# Patient Record
Sex: Female | Born: 1955 | Hispanic: No | Marital: Married | Smoking: Former smoker
Health system: Southern US, Community
[De-identification: ages and names within clinical notes are randomized; demographics above are authoritative.]

## PROBLEM LIST (undated history)

## (undated) DIAGNOSIS — J189 Pneumonia, unspecified organism: Secondary | ICD-10-CM

## (undated) DIAGNOSIS — F419 Anxiety disorder, unspecified: Secondary | ICD-10-CM

## (undated) DIAGNOSIS — M1990 Unspecified osteoarthritis, unspecified site: Secondary | ICD-10-CM

## (undated) DIAGNOSIS — I10 Essential (primary) hypertension: Secondary | ICD-10-CM

## (undated) DIAGNOSIS — E7800 Pure hypercholesterolemia, unspecified: Secondary | ICD-10-CM

## (undated) HISTORY — PX: CARDIAC CATHETERIZATION: SHX172

## (undated) HISTORY — PX: SPINAL CORD STIMULATOR IMPLANT: SHX2422

## (undated) HISTORY — PX: VAGINAL HYSTERECTOMY: SUR661

## (undated) HISTORY — DX: Unspecified osteoarthritis, unspecified site: M19.90

## (undated) HISTORY — PX: JOINT REPLACEMENT: SHX530

## (undated) HISTORY — DX: Pure hypercholesterolemia, unspecified: E78.00

## (undated) HISTORY — PX: ACHILLES TENDON SURGERY: SHX542

## (undated) HISTORY — PX: WISDOM TOOTH EXTRACTION: SHX21

## (undated) HISTORY — PX: TONSILLECTOMY: SUR1361

---

## 1988-08-24 HISTORY — PX: BREAST LUMPECTOMY: SHX2

## 2020-01-15 DIAGNOSIS — M9901 Segmental and somatic dysfunction of cervical region: Secondary | ICD-10-CM | POA: Diagnosis not present

## 2020-01-15 DIAGNOSIS — M9902 Segmental and somatic dysfunction of thoracic region: Secondary | ICD-10-CM | POA: Diagnosis not present

## 2020-01-15 DIAGNOSIS — M542 Cervicalgia: Secondary | ICD-10-CM | POA: Diagnosis not present

## 2020-01-15 DIAGNOSIS — G44201 Tension-type headache, unspecified, intractable: Secondary | ICD-10-CM | POA: Diagnosis not present

## 2020-01-16 DIAGNOSIS — M9902 Segmental and somatic dysfunction of thoracic region: Secondary | ICD-10-CM | POA: Diagnosis not present

## 2020-01-16 DIAGNOSIS — M542 Cervicalgia: Secondary | ICD-10-CM | POA: Diagnosis not present

## 2020-01-16 DIAGNOSIS — G44201 Tension-type headache, unspecified, intractable: Secondary | ICD-10-CM | POA: Diagnosis not present

## 2020-01-16 DIAGNOSIS — M9901 Segmental and somatic dysfunction of cervical region: Secondary | ICD-10-CM | POA: Diagnosis not present

## 2020-01-17 DIAGNOSIS — G44201 Tension-type headache, unspecified, intractable: Secondary | ICD-10-CM | POA: Diagnosis not present

## 2020-01-17 DIAGNOSIS — M542 Cervicalgia: Secondary | ICD-10-CM | POA: Diagnosis not present

## 2020-01-17 DIAGNOSIS — G4733 Obstructive sleep apnea (adult) (pediatric): Secondary | ICD-10-CM | POA: Diagnosis not present

## 2020-01-17 DIAGNOSIS — M9902 Segmental and somatic dysfunction of thoracic region: Secondary | ICD-10-CM | POA: Diagnosis not present

## 2020-01-17 DIAGNOSIS — M9901 Segmental and somatic dysfunction of cervical region: Secondary | ICD-10-CM | POA: Diagnosis not present

## 2020-01-24 DIAGNOSIS — M9902 Segmental and somatic dysfunction of thoracic region: Secondary | ICD-10-CM | POA: Diagnosis not present

## 2020-01-24 DIAGNOSIS — M9901 Segmental and somatic dysfunction of cervical region: Secondary | ICD-10-CM | POA: Diagnosis not present

## 2020-01-24 DIAGNOSIS — G44201 Tension-type headache, unspecified, intractable: Secondary | ICD-10-CM | POA: Diagnosis not present

## 2020-01-24 DIAGNOSIS — M542 Cervicalgia: Secondary | ICD-10-CM | POA: Diagnosis not present

## 2020-01-25 DIAGNOSIS — G44201 Tension-type headache, unspecified, intractable: Secondary | ICD-10-CM | POA: Diagnosis not present

## 2020-01-25 DIAGNOSIS — M9902 Segmental and somatic dysfunction of thoracic region: Secondary | ICD-10-CM | POA: Diagnosis not present

## 2020-01-25 DIAGNOSIS — M9901 Segmental and somatic dysfunction of cervical region: Secondary | ICD-10-CM | POA: Diagnosis not present

## 2020-01-25 DIAGNOSIS — M542 Cervicalgia: Secondary | ICD-10-CM | POA: Diagnosis not present

## 2020-01-26 DIAGNOSIS — G44201 Tension-type headache, unspecified, intractable: Secondary | ICD-10-CM | POA: Diagnosis not present

## 2020-01-26 DIAGNOSIS — M542 Cervicalgia: Secondary | ICD-10-CM | POA: Diagnosis not present

## 2020-01-26 DIAGNOSIS — M9902 Segmental and somatic dysfunction of thoracic region: Secondary | ICD-10-CM | POA: Diagnosis not present

## 2020-01-26 DIAGNOSIS — M9901 Segmental and somatic dysfunction of cervical region: Secondary | ICD-10-CM | POA: Diagnosis not present

## 2020-01-30 ENCOUNTER — Other Ambulatory Visit: Payer: Self-pay

## 2020-01-30 ENCOUNTER — Encounter: Payer: Self-pay | Admitting: Internal Medicine

## 2020-01-30 ENCOUNTER — Ambulatory Visit (INDEPENDENT_AMBULATORY_CARE_PROVIDER_SITE_OTHER): Payer: Federal, State, Local not specified - PPO | Admitting: Internal Medicine

## 2020-01-30 VITALS — BP 142/68 | HR 55 | Temp 98.2°F | Ht 63.0 in | Wt 205.3 lb

## 2020-01-30 DIAGNOSIS — R001 Bradycardia, unspecified: Secondary | ICD-10-CM

## 2020-01-30 DIAGNOSIS — M797 Fibromyalgia: Secondary | ICD-10-CM | POA: Insufficient documentation

## 2020-01-30 DIAGNOSIS — F32A Depression, unspecified: Secondary | ICD-10-CM | POA: Insufficient documentation

## 2020-01-30 DIAGNOSIS — F339 Major depressive disorder, recurrent, unspecified: Secondary | ICD-10-CM

## 2020-01-30 DIAGNOSIS — G4733 Obstructive sleep apnea (adult) (pediatric): Secondary | ICD-10-CM | POA: Insufficient documentation

## 2020-01-30 DIAGNOSIS — F909 Attention-deficit hyperactivity disorder, unspecified type: Secondary | ICD-10-CM

## 2020-01-30 DIAGNOSIS — R9431 Abnormal electrocardiogram [ECG] [EKG]: Secondary | ICD-10-CM

## 2020-01-30 DIAGNOSIS — F329 Major depressive disorder, single episode, unspecified: Secondary | ICD-10-CM | POA: Diagnosis not present

## 2020-01-30 HISTORY — DX: Obstructive sleep apnea (adult) (pediatric): G47.33

## 2020-01-30 HISTORY — DX: Bradycardia, unspecified: R00.1

## 2020-01-30 HISTORY — DX: Attention-deficit hyperactivity disorder, unspecified type: F90.9

## 2020-01-30 HISTORY — DX: Depression, unspecified: F32.A

## 2020-01-30 HISTORY — DX: Fibromyalgia: M79.7

## 2020-01-30 MED ORDER — LIDOCAINE 5 % EX PTCH: 1.0000 | MEDICATED_PATCH | Freq: Two times a day (BID) | CUTANEOUS | 0 refills | Status: DC

## 2020-01-30 MED ORDER — DICLOFENAC SODIUM 25 MG PO TBEC: 25.0000 mg | DELAYED_RELEASE_TABLET | Freq: Two times a day (BID) | ORAL | 2 refills | Status: DC

## 2020-01-30 MED ORDER — ESCITALOPRAM OXALATE 20 MG PO TABS
20.0000 mg | ORAL_TABLET | Freq: Every day | ORAL | 2 refills | Status: DC
Start: 2020-01-30 — End: 2020-07-11

## 2020-01-30 NOTE — Assessment & Plan Note (Signed)
Patient reports having history of depression and is currently on Lexapro 10 mg daily, reports that it is doing well in controlling her depression.  Discussed increasing the Lexapro to 20 mg daily to try to improve her fibromyalgia pain, she is in agreement with this plan.  -Increase Lexapro to 20 mg daily -Obtain PHQ-9 on next visit

## 2020-01-30 NOTE — Progress Notes (Signed)
   CC: Establish care for fibromyalgia, OSA, depression, arthritis, ADHD, and abnormal EKG findings  HPI:  [name] is a 64 y.o. with a history of fibromyalgia, arthritis, sleep apnea, depression, ADHD, and abnormal EKG findings who is presenting for establishment of care with a new clinic.  She reports moving from [place] in March, has not had a PCP since then.  She reports that her prior primary care provider would not refill her medications, she is only taking Lexapro at this time.  No past medical history on file. Review of Systems:   Constitutional: Negative for chills and fever.  Respiratory: Negative for shortness of breath.   Cardiovascular: Negative for chest pain, reports bilateral leg swelling.  Gastrointestinal: Negative for abdominal pain, nausea and vomiting.  Neurological: Negative for dizziness and headaches.   Social: Denies smoking or drug use.  Reports occasional alcohol use about 1 to 2 glasses of wine per week.  Family: Grandmother had diabetes.  Mother had thyroid issue.  Father had cardiac issues.  Unclear what type.  Physical Exam:  Vitals:   01/30/20 0949  BP: (!) 142/68  Pulse: (!) 55  Temp: 98.2 F (36.8 C)  TempSrc: Oral  SpO2: 98%  Weight: 205 lb 4.8 oz (93.1 kg)  Height: 5\' 3"  (1.6 m)   Physical Exam  Constitutional: She is oriented to person, place, and time and well-developed, well-nourished, and in no distress.  HENT:  Head: Normocephalic and atraumatic.  Eyes: Pupils are equal, round, and reactive to light. EOM are normal.  Cardiovascular: Normal rate, regular rhythm and normal heart sounds.  Pulmonary/Chest: Effort normal and breath sounds normal. No respiratory distress.  Abdominal: Soft. Bowel sounds are normal. She exhibits no distension.  Musculoskeletal:        General: Tenderness (TTP over lower back area, shoulders, elbows, hips, thighs, knees, and shins) present. No edema. Normal range of motion.     Cervical back:  Normal range of motion and neck supple.  Neurological: She is alert and oriented to person, place, and time.  Skin: Skin is warm and dry. No erythema.  Psychiatric: Mood and affect normal.    Assessment & Plan:   See Encounters Tab for problem based charting.  Patient discussed with Dr. Heber Alma

## 2020-01-30 NOTE — Assessment & Plan Note (Signed)
Patient reports having history of fibromyalgia that was diagnosed about 25 years ago, she reports generalized pain, in her back, hips, shoulders, knees, neck, legs, and feet.  She reports that she had been on Voltaren tablets and a Lidoderm patch.  She has tried gabapentin however does not like taking that medication due to concerns about becoming "addicted" to the medication.  She currently has a TENS unit in place that she obtained over-the-counter, reports that she uses it constantly.  Patient does have a history of depression and is currently on Lexapro 10 mg daily, she reports that that is the only medication that she is taking.  On exam she does have generalized tenderness to palpation diffusely, TENS unit is in place over her lower back area.  Discussed that we can refill the Voltaren tablets and Lidoderm patch for now however will need to obtain the records from her prior PCP to see what was done before.  Also discussed increasing her Lexapro to 20 mg daily to try to improve her pain control.  She is in agreement with this plan.  -Obtain records from prior PCP -Voltaren tablets 25 mg twice daily -Lidoderm patch -Increase Lexapro to 20 mg daily -Referral to [facility] to discuss therapy

## 2020-01-30 NOTE — Assessment & Plan Note (Signed)
Patient reports that she has a history of sleep apnea and has a CPAP at night, will need to obtain records from prior PCP to see what sleep study showed.  -Continue CPAP for now

## 2020-01-30 NOTE — Patient Instructions (Addendum)
[name],  It was a pleasure to see you today. Thank you for coming in.   Today we discussed your fibromyalgia, depression, sleep apnea, arthritis, and abnormal EKG.  We will need to obtain records from your prior clinic to see what was done.  For the fibromyalgia I have refilled the Voltaren tablets and Lidoderm patch, I have also increased your Lexapro to 20 mg daily.  We can continue to adjust your medications when we follow-up.  We also discussed abnormal EKG.  We will obtain records from your prior clinic and then may refer you to cardiology.  We are getting some basic labs and will contact you if they are abnormal.   Please return to clinic in 2 to 4 weeks or sooner if needed.   Thank you again for coming in.   Asencion Noble.D.

## 2020-01-30 NOTE — Assessment & Plan Note (Signed)
Patient reports having history of ADHD and had been on Strattera in the past, she last took it a few months ago.  It is unclear how interfering this is with her life, unclear if she needs this.  Discussed referral to psychiatry for further evaluation and she is in agreement with this.  -Referral to psychiatry via [facility]

## 2020-01-30 NOTE — Assessment & Plan Note (Signed)
Patient reports that she had an abnormal EKG while she was at her primary care provider's office, she is not sure what this showed.  She denied any chest pain, palpitations, headaches, lightheadedness, dizziness, fatigue.  Does report some shortness of breath with exertion however this is mild.  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  Unclear what this EKG showed, will obtain records from PCPs office before referring to cardiology.  -Obtain records from PCP office

## 2020-01-31 DIAGNOSIS — G44201 Tension-type headache, unspecified, intractable: Secondary | ICD-10-CM | POA: Diagnosis not present

## 2020-01-31 DIAGNOSIS — M542 Cervicalgia: Secondary | ICD-10-CM | POA: Diagnosis not present

## 2020-01-31 DIAGNOSIS — M9902 Segmental and somatic dysfunction of thoracic region: Secondary | ICD-10-CM | POA: Diagnosis not present

## 2020-01-31 DIAGNOSIS — M9901 Segmental and somatic dysfunction of cervical region: Secondary | ICD-10-CM | POA: Diagnosis not present

## 2020-01-31 LAB — BMP8+ANION GAP
Anion Gap: 14 mmol/L (ref 10.0–18.0)
BUN/Creatinine Ratio: 25 (ref 12–28)
BUN: 18 mg/dL (ref 8–27)
CO2: 22 mmol/L (ref 20–29)
Calcium: 9.2 mg/dL (ref 8.7–10.3)
Chloride: 104 mmol/L (ref 96–106)
Creatinine, Ser: 0.73 mg/dL (ref 0.57–1.00)
GFR calc Af Amer: 101 mL/min/{1.73_m2} (ref >59)
GFR calc non Af Amer: 88 mL/min/{1.73_m2} (ref >59)
Glucose: 97 mg/dL (ref 65–99)
Potassium: 4.2 mmol/L (ref 3.5–5.2)
Sodium: 140 mmol/L (ref 134–144)

## 2020-02-02 DIAGNOSIS — G44201 Tension-type headache, unspecified, intractable: Secondary | ICD-10-CM | POA: Diagnosis not present

## 2020-02-02 DIAGNOSIS — M9901 Segmental and somatic dysfunction of cervical region: Secondary | ICD-10-CM | POA: Diagnosis not present

## 2020-02-02 DIAGNOSIS — M9902 Segmental and somatic dysfunction of thoracic region: Secondary | ICD-10-CM | POA: Diagnosis not present

## 2020-02-02 DIAGNOSIS — M542 Cervicalgia: Secondary | ICD-10-CM | POA: Diagnosis not present

## 2020-02-02 NOTE — Progress Notes (Signed)
Internal Medicine Clinic Attending  Case discussed with Dr. Krienke at the time of the visit.  We reviewed the resident's history and exam and pertinent patient test results.  I agree with the assessment, diagnosis, and plan of care documented in the resident's note.    

## 2020-02-06 DIAGNOSIS — G44201 Tension-type headache, unspecified, intractable: Secondary | ICD-10-CM | POA: Diagnosis not present

## 2020-02-06 DIAGNOSIS — M9901 Segmental and somatic dysfunction of cervical region: Secondary | ICD-10-CM | POA: Diagnosis not present

## 2020-02-06 DIAGNOSIS — M9902 Segmental and somatic dysfunction of thoracic region: Secondary | ICD-10-CM | POA: Diagnosis not present

## 2020-02-06 DIAGNOSIS — M542 Cervicalgia: Secondary | ICD-10-CM | POA: Diagnosis not present

## 2020-02-13 ENCOUNTER — Ambulatory Visit (INDEPENDENT_AMBULATORY_CARE_PROVIDER_SITE_OTHER): Payer: Federal, State, Local not specified - PPO | Admitting: Internal Medicine

## 2020-02-13 ENCOUNTER — Ambulatory Visit (HOSPITAL_COMMUNITY)
Admission: RE | Admit: 2020-02-13 | Discharge: 2020-02-13 | Disposition: A | Discharge status: Home / Self Care | Payer: Federal, State, Local not specified - PPO | Source: Ambulatory Visit | Attending: Internal Medicine | Admitting: Internal Medicine

## 2020-02-13 ENCOUNTER — Encounter: Payer: Self-pay | Admitting: Internal Medicine

## 2020-02-13 VITALS — BP 130/72 | HR 75 | Temp 98.3°F | Ht 63.0 in | Wt 204.3 lb

## 2020-02-13 DIAGNOSIS — R9431 Abnormal electrocardiogram [ECG] [EKG]: Secondary | ICD-10-CM | POA: Insufficient documentation

## 2020-02-13 DIAGNOSIS — R001 Bradycardia, unspecified: Secondary | ICD-10-CM

## 2020-02-13 DIAGNOSIS — M797 Fibromyalgia: Secondary | ICD-10-CM | POA: Diagnosis not present

## 2020-02-13 DIAGNOSIS — F329 Major depressive disorder, single episode, unspecified: Secondary | ICD-10-CM | POA: Diagnosis not present

## 2020-02-13 MED ORDER — DICLOFENAC SODIUM 50 MG PO TBEC: 50.0000 mg | DELAYED_RELEASE_TABLET | Freq: Two times a day (BID) | ORAL | 2 refills | Status: DC | PRN

## 2020-02-13 NOTE — Assessment & Plan Note (Addendum)
Alexa Rodriguez presents for f/u visit. Seen by Dr.Krienke at prior visit and advised to bring her records from her PCP in [place], [place]. She mentions concern regarding prior hx of hospitalization in [place] requiring cardiac ICU stay due to viral pericarditis vs myocarditis complicated by adverse reaction to metoprolol. She mentions that she was seen by cardiologist in the past with further work-up including stress test and cath which she states she was never informed the result of. She states she was told she need to re-establish care with cardiology for her 'heart failure' but states she would like to avoid if possible.  A/P PCP record reviewed with no evidence of significant cardiac issue. EKG from 2017 w/o significant findings. Unable to find stress test / echocardiogram results. Problem list from PCP lists LVH but no CHF. Has mild lower extremity edema on exam but appear to be primarily from varicose veins. Is on furosemide 40mg . She does have hx of obstructive sleep apnea which would put her at risk for right-sided heart failure but clinical suspicion for systolic heart failure is low. 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 Will get updated EKG to assess for need for further work-up.  EKG in clinic show sinus bradycardia w/o heart block. No evidence of ischemic changes. - C/w furosemide 40mg  daily - Monitor for sxs

## 2020-02-13 NOTE — Assessment & Plan Note (Signed)
Presents w/ f/u visit for fibromyalgia. Diagnosed in her 29s. Have been 'dealing with it' for most of her life. Mentions generalized pain all over her body. Currently taking voltaren PO and lidoderm patch as needed. Describes refusal to take gabapentin or duloxetine due to side effects. Currently pain well controlled and uses primarily heat pads and lido-derm patches for the pain. States she takes voltaren as needed but mentions taking 50mg  rather than 25mg  due to lack of effect at lower dose.  - Discussed in prolonged detail regarding risks of NSAID overuse. - BMP reviewed w/ stable renal fx - C/w voltaren 50mg  PRN - C/w lidoderm patch, heat pads - F/u w/ [facility]

## 2020-02-13 NOTE — Progress Notes (Signed)
CC: Fibromyalgia  HPI: [name] is a 64 y.o. with PMH listed below presenting with complaint of fibromyalgia. Please see problem based assessment and plan for further details.  No past medical history on file.  Review of Systems: Review of Systems  Constitutional: Negative for chills, fever and malaise/fatigue.  Eyes: Negative for blurred vision.  Respiratory: Negative for shortness of breath.   Cardiovascular: Positive for leg swelling. Negative for chest pain and palpitations.  Gastrointestinal: Negative for constipation, diarrhea, nausea and vomiting.  Musculoskeletal: Positive for back pain, joint pain and myalgias.  Neurological: Negative for dizziness and headaches.    Physical Exam: Vitals:   02/13/20 1528  BP: 130/72  Pulse: 75  Temp: 98.3 F (36.8 C)  TempSrc: Oral  SpO2: 96%  Weight: 204 lb 4.8 oz (92.7 kg)  Height: 5\' 3"  (1.6 m)    Physical Exam  Constitutional: She is oriented to person, place, and time. No distress.  HENT:  Head: Normocephalic and atraumatic.  Mouth/Throat: Mucous membranes are moist. Oropharynx is clear.  Eyes: Conjunctivae are normal.  Neck:  No JVD  Cardiovascular: Normal rate, regular rhythm, normal heart sounds and normal pulses.  No murmur heard. Respiratory: Effort normal and breath sounds normal. She has no wheezes. She has no rales.  GI: Soft. Normal appearance and bowel sounds are normal. She exhibits no distension. There is no abdominal tenderness.  Musculoskeletal:        General: Swelling (trace ankle edema) present. Normal range of motion.     Comments: BLE varicose veins  Neurological: She is alert and oriented to person, place, and time.  Skin: Skin is warm and dry.    Assessment & Plan:   Depression [name] is a 64 yo F w/ PMH of depression, fibromyalgia, OSA presenting to [facility] for f/u for depression. She was on lexapro 10mg  but dosage was increased to 20mg . Since then, she mentions having  significant improvement in her mood and expresses interest in continuing current dose. Denies any significant side effect although endorsing unintentional weight gain which has been ongoing 'for years.' She denies any suicidal/homicideal ideations. Does continue to endorse difficulty with sleep, low energy and trouble concentrating which she attributes to pain from her fibromyalgia rather than her depression.  A/P PHQ-9 of 14 at this visit. Unclear if improved form prior but patient denies interest in increasing dose or trail other anti-depressants. Per history, appears high score due to fibromyalgia rather than MDD.   - C/w escitalopram 20mg  daily  Fibromyalgia Presents w/ f/u visit for fibromyalgia. Diagnosed in her 1s. Have been 'dealing with it' for most of her life. Mentions generalized pain all over her body. Currently taking voltaren PO and lidoderm patch as needed. Describes refusal to take gabapentin or duloxetine due to side effects. Currently pain well controlled and uses primarily heat pads and lido-derm patches for the pain. States she takes voltaren as needed but mentions taking 50mg  rather than 25mg  due to lack of effect at lower dose.  - Discussed in prolonged detail regarding risks of NSAID overuse. - BMP reviewed w/ stable renal fx - C/w voltaren 50mg  PRN - C/w lidoderm patch, heat pads - F/u w/ [facility]  Bradycardia on ECG [name] presents for f/u visit. Seen by Dr.Krienke at prior visit and advised to bring her records from her PCP in [place], [place]. She mentions concern regarding prior hx of hospitalization in [place] requiring cardiac ICU stay due to viral pericarditis vs myocarditis complicated by adverse reaction to  metoprolol. She mentions that she was seen by cardiologist in the past with further work-up including stress test and cath which she states she was never informed the result of. She states she was told she need to re-establish care with cardiology for her  'heart failure' but states she would like to avoid if possible.  A/P PCP record reviewed with no evidence of significant cardiac issue. EKG from 2017 w/o significant findings. Unable to find stress test / echocardiogram results. Problem list from PCP lists LVH but no CHF. Has mild lower extremity edema on exam but appear to be primarily from varicose veins. Is on furosemide 40mg . She does have hx of obstructive sleep apnea which would put her at risk for right-sided heart failure but clinical suspicion for systolic heart failure is low. 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 Will get updated EKG to assess for need for further work-up.  EKG in clinic show sinus bradycardia w/o heart block. No evidence of ischemic changes. - C/w furosemide 40mg  daily - Monitor for sxs    Patient discussed with Dr. Philipp Ovens   -Gilberto Better, PGY2 [facility] Pager: [phone]

## 2020-02-13 NOTE — Assessment & Plan Note (Signed)
Alexa Rodriguez is a 64 yo F w/ PMH of depression, fibromyalgia, OSA presenting to [facility] for f/u for depression. She was on lexapro 10mg  but dosage was increased to 20mg . Since then, she mentions having significant improvement in her mood and expresses interest in continuing current dose. Denies any significant side effect although endorsing unintentional weight gain which has been ongoing 'for years.' She denies any suicidal/homicideal ideations. Does continue to endorse difficulty with sleep, low energy and trouble concentrating which she attributes to pain from her fibromyalgia rather than her depression.  A/P PHQ-9 of 14 at this visit. Unclear if improved form prior but patient denies interest in increasing dose or trail other anti-depressants. Per history, appears high score due to fibromyalgia rather than MDD.   - C/w escitalopram 20mg  daily

## 2020-02-13 NOTE — Patient Instructions (Addendum)
Thank you for allowing [place] to provide your care today. Today we discussed your prior heart issues    I have ordered no labs for you. I will call if any are abnormal.    Today we made no changes to your medications.    Please follow-up in 6 months.    Should you have any questions or concerns please call the internal medicine clinic at [phone].    Diclofenac sodium enteric-coat  ed tablets What is this medicine? DICLOFENAC (dye KLOE fen ak) is a non-steroidal anti-inflammatory drug (NSAID). It is used to reduce swelling and to treat pain. It may be used to treat osteoarthritis, rheumatoid arthritis, and ankylosing spondylitis. This medicine may be used for other purposes; ask your health care provider or pharmacist if you have questions. COMMON BRAND NAME(S): Voltaren What should I tell my health care provider before I take this medicine? They need to know if you have any of these conditions:  asthma, especially aspirin sensitive asthma  coronary artery bypass graft (CABG) surgery within the past 2 weeks  drink more than 3 alcohol containing drinks a day  heart disease or circulation problems like heart failure or leg edema (fluid retention)  high blood pressure  kidney disease  liver disease  stomach bleeding or ulcers  an unusual or allergic reaction to diclofenac, aspirin, other NSAIDs, other medicines, foods, dyes, or preservatives  pregnant or trying to get pregnant  breast-feeding How should I use this medicine? Take this medicine by mouth with food and with a full glass of water. Do not crush or chew the medicine. Follow the directions on the prescription label. Take your medicine at regular intervals. Do not take your medicine more often than directed. Long-term, continuous use may increase the risk of heart attack or stroke. A special MedGuide will be given to you by the pharmacist with each prescription and refill. Be sure to read this information carefully each  time. Talk to your pediatrician regarding the use of this medicine in children. Special care may be needed. Elderly patients over 2 years old may have a stronger reaction and need a smaller dose. Overdosage: If you think you have taken too much of this medicine contact a poison control center or emergency room at once. NOTE: This medicine is only for you. Do not share this medicine with others. What if I miss a dose? If you miss a dose, take it as soon as you can. If it is almost time for your next dose, take only that dose. Do not take double or extra doses. What may interact with this medicine? Do not take this medicine with any of the following medications:  cidofovir  ketorolac  methotrexate This medicine may also interact with the following medications:  alcohol  aspirin and aspirin like medicines  cyclosporine  diuretics  lithium  medicines for blood pressure  medicines for osteoporosis  medicines that affect platelets  medicines that treat or prevent blood clots like warfarin  NSAIDs, medicines for pain and inflammation, like ibuprofen or naproxen  pemetrexed  steroid medicines like prednisone or cortisone This list may not describe all possible interactions. Give your health care provider a list of all the medicines, herbs, non-prescription drugs, or dietary supplements you use. Also tell them if you smoke, drink alcohol, or use illegal drugs. Some items may interact with your medicine. What should I watch for while using this medicine? Tell your doctor or healthcare provider if your pain does not get better. Talk  to your doctor before taking another medicine for pain. Do not treat yourself. This medicine may cause serious skin reactions. They can happen weeks to months after starting the medicine. Contact your healthcare provider right away if you notice fevers or flu-like symptoms with a rash. The rash may be red or purple and then turn into blisters or peeling  of the skin. Or, you might notice a red rash with swelling of the face, lips or lymph nodes in your neck or under your arms. This medicine does not prevent heart attack or stroke. In fact, this medicine may increase the chance of a heart attack or stroke. The chance may increase with longer use of this medicine and in people who have heart disease. If you take aspirin to prevent heart attack or stroke, talk with your doctor or healthcare provider. Do not take medicines such as ibuprofen and naproxen with this medicine. Side effects such as stomach upset, nausea, or ulcers may be more likely to occur. Many medicines available without a prescription should not be taken with this medicine. This medicine can cause ulcers and bleeding in the stomach and intestines at any time during treatment. Do not smoke cigarettes or drink alcohol. These increase irritation to your stomach and can make it more susceptible to damage from this medicine. Ulcers and bleeding can happen without warning symptoms and can cause death. You may get drowsy or dizzy. Do not drive, use machinery, or do anything that needs mental alertness until you know how this medicine affects you. Do not stand or sit up quickly, especially if you are an older patient. This reduces the risk of dizzy or fainting spells. This medicine can cause you to bleed more easily. Try to avoid damage to your teeth and gums when you brush or floss your teeth. What side effects may I notice from receiving this medicine? Side effects that you should report to your doctor or health care professional as soon as possible:  allergic reactions like skin rash, itching or hives, swelling of the face, lips, or tongue  black or bloody stools, blood in the urine or vomit  blurred vision  chest pain  difficulty breathing or wheezing  nausea or vomiting  rash, fever, and swollen lymph nodes  redness, blistering, peeling or loosening of the skin, including inside the  mouth  slurred speech or weakness on one side of the body  unexplained weight gain or swelling  unusually weak or tired  yellowing of eyes or skin Side effects that usually do not require medical attention (report to your doctor or health care professional if they continue or are bothersome):  constipation  diarrhea  dizziness  headache  heartburn This list may not describe all possible side effects. Call your doctor for medical advice about side effects. You may report side effects to FDA at 1-800-FDA-1088. Where should I keep my medicine? Keep out of the reach of children. Store at room temperature below 30 degrees C (86 degrees F). Protect from moisture. Keep container tightly closed. Throw away any unused medicine after the expiration date. NOTE: This sheet is a summary. It may not cover all possible information. If you have questions about this medicine, talk to your doctor, pharmacist, or health care provider.  2020 Elsevier/Gold Standard (2018-10-26 13:10:36)

## 2020-02-16 NOTE — Progress Notes (Signed)
Internal Medicine Clinic Attending ° °Case discussed with Dr. Lee at the time of the visit.  We reviewed the resident’s history and exam and pertinent patient test results.  I agree with the assessment, diagnosis, and plan of care documented in the resident’s note.  °

## 2020-02-21 ENCOUNTER — Telehealth: Payer: Self-pay | Admitting: Licensed Clinical Social Worker

## 2020-02-21 NOTE — Telephone Encounter (Signed)
Patient was called to discuss a referral for services. Patient will be added to my schedule for a phone session on 7/14 @ 11;30.

## 2020-03-06 ENCOUNTER — Telehealth: Payer: Self-pay | Admitting: Licensed Clinical Social Worker

## 2020-03-06 ENCOUNTER — Ambulatory Visit: Payer: Federal, State, Local not specified - PPO | Admitting: Licensed Clinical Social Worker

## 2020-03-06 NOTE — Addendum Note (Signed)
Addended by: Hulan Fray on: 03/06/2020 02:32 PM   Modules accepted: Orders

## 2020-03-06 NOTE — Telephone Encounter (Signed)
Patient was called for her scheduled visit. Patient did not answer, and a vm was left for the patient.

## 2020-05-07 DIAGNOSIS — L259 Unspecified contact dermatitis, unspecified cause: Secondary | ICD-10-CM | POA: Diagnosis not present

## 2020-05-07 DIAGNOSIS — R21 Rash and other nonspecific skin eruption: Secondary | ICD-10-CM | POA: Diagnosis not present

## 2020-06-04 DIAGNOSIS — L700 Acne vulgaris: Secondary | ICD-10-CM | POA: Diagnosis not present

## 2020-06-04 DIAGNOSIS — L728 Other follicular cysts of the skin and subcutaneous tissue: Secondary | ICD-10-CM | POA: Diagnosis not present

## 2020-07-11 ENCOUNTER — Other Ambulatory Visit: Payer: Self-pay

## 2020-07-11 DIAGNOSIS — M797 Fibromyalgia: Secondary | ICD-10-CM

## 2020-07-11 MED ORDER — DICLOFENAC SODIUM 50 MG PO TBEC: 50.0000 mg | DELAYED_RELEASE_TABLET | Freq: Two times a day (BID) | ORAL | 0 refills | Status: DC | PRN

## 2020-07-11 MED ORDER — ESCITALOPRAM OXALATE 20 MG PO TABS: 20.0000 mg | ORAL_TABLET | Freq: Every day | ORAL | 0 refills | Status: DC

## 2020-07-11 NOTE — Telephone Encounter (Signed)
diclofenac (VOLTAREN) 50 MG EC tablet escitalopram (LEXAPRO) 20 MG tablet, REFILL REQUEST @ [facility] ON [address] IN [place].

## 2020-07-11 NOTE — Telephone Encounter (Signed)
Sent in refill. Patient needs appointment with PCP. Thank you.

## 2020-07-16 ENCOUNTER — Encounter: Payer: Self-pay | Admitting: Student

## 2020-08-08 ENCOUNTER — Encounter: Payer: Self-pay | Admitting: Internal Medicine

## 2020-08-08 ENCOUNTER — Other Ambulatory Visit: Payer: Self-pay

## 2020-08-08 ENCOUNTER — Ambulatory Visit (INDEPENDENT_AMBULATORY_CARE_PROVIDER_SITE_OTHER): Payer: Federal, State, Local not specified - PPO | Admitting: Internal Medicine

## 2020-08-08 ENCOUNTER — Ambulatory Visit (HOSPITAL_COMMUNITY)
Admission: RE | Admit: 2020-08-08 | Discharge: 2020-08-08 | Disposition: A | Discharge status: Home / Self Care | Payer: Federal, State, Local not specified - PPO | Source: Ambulatory Visit | Attending: Internal Medicine | Admitting: Internal Medicine

## 2020-08-08 VITALS — BP 131/57 | HR 63 | Temp 98.3°F | Ht 63.0 in | Wt 207.4 lb

## 2020-08-08 DIAGNOSIS — R011 Cardiac murmur, unspecified: Secondary | ICD-10-CM

## 2020-08-08 DIAGNOSIS — Z0000 Encounter for general adult medical examination without abnormal findings: Secondary | ICD-10-CM

## 2020-08-08 DIAGNOSIS — Z1231 Encounter for screening mammogram for malignant neoplasm of breast: Secondary | ICD-10-CM

## 2020-08-08 DIAGNOSIS — R739 Hyperglycemia, unspecified: Secondary | ICD-10-CM

## 2020-08-08 DIAGNOSIS — I208 Other forms of angina pectoris: Secondary | ICD-10-CM | POA: Diagnosis not present

## 2020-08-08 DIAGNOSIS — M797 Fibromyalgia: Secondary | ICD-10-CM

## 2020-08-08 DIAGNOSIS — R079 Chest pain, unspecified: Secondary | ICD-10-CM

## 2020-08-08 DIAGNOSIS — G4733 Obstructive sleep apnea (adult) (pediatric): Secondary | ICD-10-CM

## 2020-08-08 DIAGNOSIS — F329 Major depressive disorder, single episode, unspecified: Secondary | ICD-10-CM

## 2020-08-08 MED ORDER — NITROGLYCERIN 0.4 MG SL SUBL: 0.4000 mg | SUBLINGUAL_TABLET | SUBLINGUAL | 1 refills | Status: AC | PRN

## 2020-08-08 MED ORDER — AMLODIPINE BESYLATE 5 MG PO TABS: 5.0000 mg | ORAL_TABLET | Freq: Every day | ORAL | 5 refills | Status: DC

## 2020-08-08 MED ORDER — ASPIRIN EC 81 MG PO TBEC: 81.0000 mg | DELAYED_RELEASE_TABLET | Freq: Every day | ORAL | 3 refills | Status: AC

## 2020-08-08 MED ORDER — DULOXETINE HCL 60 MG PO CPEP
60.0000 mg | ORAL_CAPSULE | Freq: Every day | ORAL | 5 refills | Status: DC
Start: 2020-08-08 — End: 2021-03-17

## 2020-08-08 NOTE — Patient Instructions (Addendum)
   Today we made the following changes to your medications:    Please STOP taking   lexapro  Please follow-up in one month to assess fibromyalgia symptoms and for follow-up of chest pain.    Please call the internal medicine center clinic if you have any questions or concerns, we may be able to help and keep you from a long and expensive emergency room wait. Our clinic and after hours phone number is [phone], the best time to call is Monday through Friday 9 am to 4 pm but there is always someone available 24/7 if you have an emergency. If you need medication refills please notify your pharmacy one week in advance and they will send [place] a request.

## 2020-08-08 NOTE — Progress Notes (Signed)
e  CC: chest pain  HPI:  [name] is a 64 y.o. with PMH as below.   Please see A&P for assessment of the patient's acute and chronic medical conditions.    No past medical history on file. Review of Systems:   Review of Systems  Constitutional: Negative for chills, diaphoresis, fever and weight loss.  Respiratory: Positive for cough and shortness of breath. Negative for wheezing.   Cardiovascular: Positive for chest pain and leg swelling. Negative for palpitations.  Gastrointestinal: Positive for heartburn. Negative for abdominal pain, constipation, diarrhea, nausea and vomiting.  Genitourinary: Negative for dysuria, frequency and urgency.  Musculoskeletal: Positive for joint pain and myalgias. Negative for falls.  Neurological: Negative for dizziness and focal weakness.  Psychiatric/Behavioral: Positive for depression. Negative for suicidal ideas. The patient is nervous/anxious.    Physical Exam:   Constitution: NAD, appears stated age HENT: AT/[employer] Eyes: no icterus or injection, eom intact Cardio: RRR, II/VI systolic murmur LUSB, no LE edema  Respiratory: clear to auscultation bilaterally Abdominal: NTTP, soft, non-distended MSK: moving all extremities, no chest wall tenderness Neuro: normal affect, a&ox3 Skin: c/d/i    Vitals:   08/08/20 1440  BP: (!) 131/57  Pulse: 63  Temp: 98.3 F (36.8 C)  SpO2: 99%  Weight: 207 lb 6.4 oz (94.1 kg)  Height: 5\' 3"  (1.6 m)   Assessment & Plan:   See Encounters Tab for problem based charting.  Patient discussed with Dr. Evette Doffing

## 2020-08-09 DIAGNOSIS — I2089 Other forms of angina pectoris: Secondary | ICD-10-CM

## 2020-08-09 DIAGNOSIS — I208 Other forms of angina pectoris: Secondary | ICD-10-CM | POA: Insufficient documentation

## 2020-08-09 HISTORY — DX: Other forms of angina pectoris: I20.8

## 2020-08-09 HISTORY — DX: Other forms of angina pectoris: I20.89

## 2020-08-09 LAB — BMP8+ANION GAP
Anion Gap: 16 mmol/L (ref 10.0–18.0)
BUN/Creatinine Ratio: 27 (ref 12–28)
BUN: 20 mg/dL (ref 8–27)
CO2: 22 mmol/L (ref 20–29)
Calcium: 9.6 mg/dL (ref 8.7–10.3)
Chloride: 104 mmol/L (ref 96–106)
Creatinine, Ser: 0.75 mg/dL (ref 0.57–1.00)
GFR calc Af Amer: 97 mL/min/{1.73_m2} (ref >59)
GFR calc non Af Amer: 85 mL/min/{1.73_m2} (ref >59)
Glucose: 93 mg/dL (ref 65–99)
Potassium: 3.8 mmol/L (ref 3.5–5.2)
Sodium: 142 mmol/L (ref 134–144)

## 2020-08-09 LAB — LIPID PANEL
Chol/HDL Ratio: 3.1 ratio (ref 0.0–4.4)
Cholesterol, Total: 229 mg/dL — ABNORMAL HIGH (ref 100–199)
HDL: 73 mg/dL (ref >39)
LDL Chol Calc (NIH): 131 mg/dL — ABNORMAL HIGH (ref 0–99)
Triglycerides: 143 mg/dL (ref 0–149)
VLDL Cholesterol Cal: 25 mg/dL (ref 5–40)

## 2020-08-09 LAB — HEMOGLOBIN A1C
Est. average glucose Bld gHb Est-mCnc: 120 mg/dL
Hgb A1c MFr Bld: 5.8 % — ABNORMAL HIGH (ref 4.8–5.6)

## 2020-08-09 NOTE — Assessment & Plan Note (Addendum)
  She has been having shortness of breath and chest and shoulder pain when she walks [place] and even sometimes when walking around her house for about the past six months. She denies nausea, palpitations, dizziness.  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 After admission she had outpatient work-up but cannot see stress test results. Her previous records list [facility] for history only.  On physical exam she is RRR with II/VI early systolic murmur. ECG shows NSR with slight left axis deviation.  Symptoms consistent with stable angina. Unable to start a betablocker with history of reaction and current HR 63.   - Start amlodipine 5 mg qd, asa 81 mg qd, nitroglycerin prn  - lipid panel, A1c - referred for stress test  - Echo ordered

## 2020-08-11 DIAGNOSIS — Z0000 Encounter for general adult medical examination without abnormal findings: Secondary | ICD-10-CM

## 2020-08-11 HISTORY — DX: Encounter for general adult medical examination without abnormal findings: Z00.00

## 2020-08-11 NOTE — Assessment & Plan Note (Signed)
  Her fibromyalgia symptoms have been much worse recently. She has pain in her shoulders and arms and legs with tenderness. No tingling or numbness. She took one of her daughter's gabapentins and states this helped. She has been avoiding starting medication for this.  Discussed starting duloxetine, risk and benefits, to treat both her depression and fibromyalgia and she is agreeable.   - start duloxetine 60 mg qd  - stop fluoxetine - f/u in one month

## 2020-08-11 NOTE — Assessment & Plan Note (Signed)
-   Last colonoscopy 2-3 years ago was normal, do not have records here   - mammogram ordered today  - she has had her covid vaccine

## 2020-08-11 NOTE — Assessment & Plan Note (Signed)
She feels that she still has been feeling down but mostly due to her pain. No SI. She is on fluoxetine 20 mg. PHQ-9 is 9 compared to 14 prior without change in medication. We are switching her fluoxetine to duloxetine today due to her fibromyalgia pain.   - f/u in one month

## 2020-08-11 NOTE — Assessment & Plan Note (Deleted)
She feels that she still has been feeling down but mostly due to her pain. No SI. She is on fluoxetine 20 mg. PHQ-9 is 9 compared to 14 prior without change in medication. We are switching her fluoxetine to duloxetine today due to her fibromyalgia pain.   - f/u in one month

## 2020-08-11 NOTE — Assessment & Plan Note (Signed)
This problem is chronic and stable. She uses her CPAP nightly.

## 2020-08-12 DIAGNOSIS — L259 Unspecified contact dermatitis, unspecified cause: Secondary | ICD-10-CM | POA: Diagnosis not present

## 2020-08-12 NOTE — Progress Notes (Signed)
[facility] Attending  Case discussed with Dr. Seawell  At the time of the visit.  We reviewed the resident's history and exam and pertinent patient test results.  I agree with the assessment, diagnosis, and plan of care documented in the resident's note.  

## 2020-08-13 DIAGNOSIS — I83813 Varicose veins of bilateral lower extremities with pain: Secondary | ICD-10-CM | POA: Diagnosis not present

## 2020-08-14 ENCOUNTER — Telehealth: Payer: Self-pay

## 2020-08-14 ENCOUNTER — Encounter: Payer: Self-pay | Admitting: Internal Medicine

## 2020-08-14 NOTE — Telephone Encounter (Signed)
Called and left another voicemail. I have also sent her a result letter.

## 2020-08-14 NOTE — Telephone Encounter (Signed)
Pt stated that she missed a call and her DR left a vmail for her to call back

## 2020-08-16 ENCOUNTER — Other Ambulatory Visit: Payer: Self-pay | Admitting: Internal Medicine

## 2020-08-16 DIAGNOSIS — M797 Fibromyalgia: Secondary | ICD-10-CM

## 2020-09-04 ENCOUNTER — Encounter: Payer: Self-pay | Admitting: *Deleted

## 2020-09-11 ENCOUNTER — Other Ambulatory Visit: Payer: Self-pay

## 2020-09-11 MED ORDER — LIDOCAINE 5 % EX PTCH: 1.0000 | MEDICATED_PATCH | Freq: Two times a day (BID) | CUTANEOUS | 0 refills | Status: DC

## 2020-09-11 NOTE — Telephone Encounter (Signed)
Received TC from patient who needs refill on lidocaine patch. Also states she can't take the amlodipine.  States she took it twice and each time approx 2 hours after taking she developed a h/a, nausea, abdominal pain.  States it felt like a migraine and she's not taking this again.  Wants to know if there's something else she can take.  Forwarding to red team. Laurence Compton, RN,BSN

## 2020-09-17 ENCOUNTER — Other Ambulatory Visit: Payer: Self-pay | Admitting: Internal Medicine

## 2020-09-17 DIAGNOSIS — M797 Fibromyalgia: Secondary | ICD-10-CM

## 2020-09-17 NOTE — Telephone Encounter (Signed)
Patient will need a follow up appointment prior to refilling her diclofenac tablets, as this was not discussed at her prior appointment.

## 2020-09-23 ENCOUNTER — Ambulatory Visit (INDEPENDENT_AMBULATORY_CARE_PROVIDER_SITE_OTHER): Payer: Federal, State, Local not specified - PPO | Admitting: Cardiology

## 2020-09-23 ENCOUNTER — Ambulatory Visit: Payer: Federal, State, Local not specified - PPO | Admitting: Cardiology

## 2020-09-23 ENCOUNTER — Encounter: Payer: Self-pay | Admitting: Cardiology

## 2020-09-23 ENCOUNTER — Other Ambulatory Visit: Payer: Self-pay

## 2020-09-23 VITALS — BP 140/78 | HR 76 | Ht 63.0 in | Wt 197.8 lb

## 2020-09-23 DIAGNOSIS — R0602 Shortness of breath: Secondary | ICD-10-CM

## 2020-09-23 DIAGNOSIS — E669 Obesity, unspecified: Secondary | ICD-10-CM

## 2020-09-23 DIAGNOSIS — R079 Chest pain, unspecified: Secondary | ICD-10-CM

## 2020-09-23 DIAGNOSIS — R072 Precordial pain: Secondary | ICD-10-CM | POA: Diagnosis not present

## 2020-09-23 DIAGNOSIS — I10 Essential (primary) hypertension: Secondary | ICD-10-CM | POA: Insufficient documentation

## 2020-09-23 DIAGNOSIS — R011 Cardiac murmur, unspecified: Secondary | ICD-10-CM

## 2020-09-23 DIAGNOSIS — R7303 Prediabetes: Secondary | ICD-10-CM

## 2020-09-23 DIAGNOSIS — E782 Mixed hyperlipidemia: Secondary | ICD-10-CM

## 2020-09-23 HISTORY — DX: Chest pain, unspecified: R07.9

## 2020-09-23 HISTORY — DX: Shortness of breath: R06.02

## 2020-09-23 HISTORY — DX: Cardiac murmur, unspecified: R01.1

## 2020-09-23 MED ORDER — IVABRADINE HCL 5 MG PO TABS: 5.0000 mg | ORAL_TABLET | Freq: Two times a day (BID) | ORAL | 0 refills | Status: DC

## 2020-09-23 MED ORDER — ATORVASTATIN CALCIUM 10 MG PO TABS: 10.0000 mg | ORAL_TABLET | Freq: Every day | ORAL | 3 refills | Status: DC

## 2020-09-23 MED ORDER — HYDROCHLOROTHIAZIDE 12.5 MG PO CAPS: 12.5000 mg | ORAL_CAPSULE | Freq: Every day | ORAL | 3 refills | Status: DC

## 2020-09-23 NOTE — Progress Notes (Signed)
Cardiology Office Note:    Date:  09/23/2020   ID:  Alexa Rodriguez, DOB 1955-09-21, MRN [id number]  PCP:  Virl Axe, MD  Cardiologist:  Berniece Salines, DO  Electrophysiologist:  None   Referring MD: Axel Filler,*   " I have been having some chest pain and shortness of breath"  History of Present Illness:    Alexa Rodriguez is a 65 y.o. female with a hx of OSA, hypertension is here today to be evaluated for chest pain or shortness of breath.  Patient tells me that she has expanded experiencing some chest pain which she described as a midsternal sensation which sometimes radiates to her shoulders and arm.  She feels sometimes this tightness and sometimes she feels a burning sensation.  She has admitted to associated shortness of breath.  She is concerned about this given premature family history of coronary artery disease with her father first CABG in his 63s.  She has had 2 heart caths in the past however with no coronary artery disease.  Past Medical History:  Diagnosis Date  . ADHD 01/30/2020  . Bradycardia on ECG 01/30/2020  . Depression 01/30/2020  . Fibromyalgia 01/30/2020  . Healthcare maintenance 08/11/2020  . OSA (obstructive sleep apnea) 01/30/2020  . Stable angina ([place]) 08/09/2020    History reviewed. No pertinent surgical history.  Current Medications: Current Meds  Medication Sig  . aspirin EC 81 MG tablet Take 1 tablet (81 mg total) by mouth daily. Swallow whole.  Marland Kitchen atorvastatin (LIPITOR) 10 MG tablet Take 1 tablet (10 mg total) by mouth daily.  . diclofenac (VOLTAREN) 50 MG EC tablet TAKE 1 TABLET(50 MG) BY MOUTH TWICE DAILY AS NEEDED  . DULoxetine (CYMBALTA) 60 MG capsule Take 1 capsule (60 mg total) by mouth daily.  . fluorometholone (FML) 0.1 % ophthalmic ointment 1 application 4 (four) times daily.  . hydrochlorothiazide (MICROZIDE) 12.5 MG capsule Take 1 capsule (12.5 mg total) by mouth daily.  . ivabradine (CORLANOR) 5 MG TABS tablet Take 1 tablet  (5 mg total) by mouth 2 (two) times daily with a meal. Take 2 hours before your Cardiac CT  . lidocaine (LIDODERM) 5 % Place 1 patch onto the skin every 12 (twelve) hours. Remove & Discard patch within 12 hours or as directed by MD  . nitroGLYCERIN (NITROSTAT) 0.4 MG SL tablet Place 1 tablet (0.4 mg total) under the tongue every 5 (five) minutes as needed for chest pain.  . [DISCONTINUED] amLODipine (NORVASC) 5 MG tablet Take 1 tablet (5 mg total) by mouth daily.     Allergies:   Lopressor [metoprolol tartrate] and Cortisone   Social History   Socioeconomic History  . Marital status: Married    Spouse name: Not on file  . Number of children: Not on file  . Years of education: Not on file  . Highest education level: Not on file  Occupational History  . Not on file  Tobacco Use  . Smoking status: Former Smoker    Packs/day: 1.50    Years: 20.00    Pack years: 30.00    Types: Cigarettes    Quit date: 08/24/2009    Years since quitting: 11.0  . Smokeless tobacco: Never Used  Substance and Sexual Activity  . Alcohol use: Not on file  . Drug use: Not on file  . Sexual activity: Not on file  Other Topics Concern  . Not on file  Social History Narrative  . Not on file   Social Determinants of  Health   Financial Resource Strain: Not on file  Food Insecurity: Not on file  Transportation Needs: Not on file  Physical Activity: Not on file  Stress: Not on file  Social Connections: Not on file     Family History: The patient's family history is not on file.  ROS:   Review of Systems  Constitution: Negative for decreased appetite, fever and weight gain.  HENT: Negative for congestion, ear discharge, hoarse voice and sore throat.   Eyes: Negative for discharge, redness, vision loss in right eye and visual halos.  Cardiovascular: Negative for chest pain, dyspnea on exertion, leg swelling, orthopnea and palpitations.  Respiratory: Negative for cough, hemoptysis, shortness of breath  and snoring.   Endocrine: Negative for heat intolerance and polyphagia.  Hematologic/Lymphatic: Negative for bleeding problem. Does not bruise/bleed easily.  Skin: Negative for flushing, nail changes, rash and suspicious lesions.  Musculoskeletal: Negative for arthritis, joint pain, muscle cramps, myalgias, neck pain and stiffness.  Gastrointestinal: Negative for abdominal pain, bowel incontinence, diarrhea and excessive appetite.  Genitourinary: Negative for decreased libido, genital sores and incomplete emptying.  Neurological: Negative for brief paralysis, focal weakness, headaches and loss of balance.  Psychiatric/Behavioral: Negative for altered mental status, depression and suicidal ideas.  Allergic/Immunologic: Negative for HIV exposure and persistent infections.    EKGs/Labs/Other Studies Reviewed:    The following studies were reviewed today:   EKG:  The ekg ordered today demonstrates sinus rhythm, with heart rate 76 bpm wandering baseline nonspecific ST changes.  Recent Labs: 08/08/2020: BUN 20; Creatinine, Ser 0.75; Potassium 3.8; Sodium 142  Recent Lipid Panel    Component Value Date/Time   CHOL 229 (H) 08/08/2020 1615   TRIG 143 08/08/2020 1615   HDL 73 08/08/2020 1615   CHOLHDL 3.1 08/08/2020 1615   LDLCALC 131 (H) 08/08/2020 1615    Physical Exam:    VS:  BP 140/78   Pulse 76   Ht 5' 3"  (1.6 m)   Wt 197 lb 12.8 oz (89.7 kg)   SpO2 97%   BMI 35.04 kg/m     Wt Readings from Last 3 Encounters:  09/23/20 197 lb 12.8 oz (89.7 kg)  08/08/20 207 lb 6.4 oz (94.1 kg)  02/13/20 204 lb 4.8 oz (92.7 kg)     GEN: Well nourished, well developed in no acute distress HEENT: Normal NECK: No JVD; No carotid bruits LYMPHATICS: No lymphadenopathy CARDIAC: S1S2 noted,RRR, 3 of 6 mid ejection systolic murmurs, rubs, gallops RESPIRATORY:  Clear to auscultation without rales, wheezing or rhonchi  ABDOMEN: Soft, non-tender, non-distended, +bowel sounds, no  guarding. EXTREMITIES: No edema, No cyanosis, no clubbing MUSCULOSKELETAL:  No deformity  SKIN: Warm and dry NEUROLOGIC:  Alert and oriented x 3, non-focal PSYCHIATRIC:  Normal affect, good insight  ASSESSMENT:    1. Precordial pain   2. Shortness of breath   3. Murmur   4. Chest pain of uncertain etiology   5. Primary hypertension   6. Mixed hyperlipidemia   7. Obesity (BMI 30-39.9)   8. Prediabetes    PLAN:    Her chest pain is concerning patient does have intermediate risk for coronary artery disease would not like to do is pursue an ischemic evaluation in this patient.  Shared decision a coronary CTA at this time is appropriate.  I have discussed with the patient about the testing.  The patient has no IV contrast allergy and is agreeable to proceed with this test.  She is on nitroglycerin I educated the patient  to use this as needed.  She does have evidence of mid ejection systolic murmur along with her shortness of breath, echocardiogram will also be done to assess LV/RV function and any other structure abnormalities.  She was hypertensive with bilateral leg edema therefore likely started patient on antihypertensive medication she has had reactions to calcium channel blockers as well as beta-blocker some, try hydrochlorothiazide 12.5 mg daily.  I reviewed her most recent lipid profile with the patient which was done in December 2021 showing HDL 73, LDL 131, total cholesterol 229, triglyceride 143 we discussed and her 10-year ASCVD risk is 14.1%.  Like to start patient on moderate intensity statin.  She is agreeable for this.  The patient understands the need to lose weight with diet and exercise. We have discussed specific strategies for this.  I discussed with the patient about her new diagnosis of prediabetes she was unaware about this.  I explained to her that lifestyle modification would really help her she intends to exercise in increase fruit and vegetable in her  diet.  The patient is in agreement with the above plan. The patient left the office in stable condition.  The patient will follow up in 3 months or sooner if needed.   Medication Adjustments/Labs and Tests Ordered: Current medicines are reviewed at length with the patient today.  Concerns regarding medicines are outlined above.  Orders Placed This Encounter  Procedures  . CT CORONARY MORPH W/CTA COR W/SCORE W/CA W/CM &/OR WO/CM  . CT CORONARY FRACTIONAL FLOW RESERVE DATA PREP  . CT CORONARY FRACTIONAL FLOW RESERVE FLUID ANALYSIS  . CBC with Differential/Platelet  . Basic metabolic panel  . EKG 12-Lead  . ECHOCARDIOGRAM COMPLETE   Meds ordered this encounter  Medications  . hydrochlorothiazide (MICROZIDE) 12.5 MG capsule    Sig: Take 1 capsule (12.5 mg total) by mouth daily.    Dispense:  90 capsule    Refill:  3  . atorvastatin (LIPITOR) 10 MG tablet    Sig: Take 1 tablet (10 mg total) by mouth daily.    Dispense:  90 tablet    Refill:  3  . ivabradine (CORLANOR) 5 MG TABS tablet    Sig: Take 1 tablet (5 mg total) by mouth 2 (two) times daily with a meal. Take 2 hours before your Cardiac CT    Dispense:  1 tablet    Refill:  0    Patient Instructions   Medication Instructions:  Your physician has recommended you make the following change in your medication:  START: Hydrochlorothiazide 12.5 mg daily START: Lipitor 10 mg daily  *If you need a refill on your cardiac medications before your next appointment, please call your pharmacy*   Lab Work: Your physician recommends that you return for lab work: TODAY BMET, CBC, TSH  If you have labs (blood work) drawn today and your tests are completely normal, you will receive your results only by: Marland Kitchen MyChart Message (if you have MyChart) OR . A paper copy in the mail If you have any lab test that is abnormal or we need to change your treatment, we will call you to review the results.   Testing/Procedures: Your physician has  requested that you have an echocardiogram. Echocardiography is a painless test that uses sound waves to create images of your heart. It provides your doctor with information about the size and shape of your heart and how well your heart's chambers and valves are working. This procedure takes approximately one hour. There  are no restrictions for this procedure.  Your cardiac CT will be scheduled at :  [facility] [address] [place], [place] [address] [phone]    [facility], please arrive at the [facility] main entrance of [facility] 30 minutes prior to test start time. Proceed to the [facility] Radiology Department (first floor) to check-in and test prep.   Please follow these instructions carefully (unless otherwise directed):   On the Night Before the Test: . Be sure to Drink plenty of water. . Do not consume any caffeinated/decaffeinated beverages or chocolate 12 hours prior to your test. . Do not take any antihistamines 12 hours prior to your test.  On the Day of the Test: . Drink plenty of water. Do not drink any water within one hour of the test. . Do not eat any food 4 hours prior to the test. . You may take your regular medications prior to the test.  . Take Ivabradine two hours prior to test. . HOLD Hydrochlorothiazide morning of the test. . FEMALES- please wear underwire-free bra if available        After the Test: . Drink plenty of water. . After receiving IV contrast, you may experience a mild flushed feeling. This is normal. . On occasion, you may experience a mild rash up to 24 hours after the test. This is not dangerous. If this occurs, you can take Benadryl 25 mg and increase your fluid intake. . If you experience trouble breathing, this can be serious. If it is severe call 911 IMMEDIATELY. If it is mild, please call our office. . If you take any of these medications: Glipizide/Metformin, Avandament, Glucavance, please do  not take 48 hours after completing test unless otherwise instructed.   Once we have confirmed authorization from your insurance company, we will call you to set up a date and time for your test. Based on how quickly your insurance processes prior authorizations requests, please allow up to 4 weeks to be contacted for scheduling your Cardiac CT appointment. Be advised that routine Cardiac CT appointments could be scheduled as many as 8 weeks after your provider has ordered it.  For non-scheduling related questions, please contact the cardiac imaging nurse navigator should you have any questions/concerns: Marchia Bond, Cardiac Imaging Nurse Navigator Burley Saver, Interim Cardiac Imaging Nurse [place] [facility] Direct Office Dial: [phone]   For scheduling needs, including cancellations and rescheduling, please call [place], [phone].     Follow-Up: At [facility], you and your health needs are our priority.  As part of our continuing mission to provide you with exceptional heart care, we have created designated Provider Care Teams.  These Care Teams include your primary Cardiologist (physician) and Advanced Practice Providers (APPs -  Physician Assistants and Nurse Practitioners) who all work together to provide you with the care you need, when you need it.  We recommend signing up for the patient portal called "MyChart".  Sign up information is provided on this After Visit Summary.  MyChart is used to connect with patients for Virtual Visits (Telemedicine).  Patients are able to view lab/test results, encounter notes, upcoming appointments, etc.  Non-urgent messages can be sent to your provider as well.   To learn more about what you can do with MyChart, go to [email or web address].    Your next appointment:   3 month(s)  The format for your next appointment:   In Person  Provider:   Godfrey Pick  Oswin Johal, DO   Other Instructions  Mediterranean  Diet A Mediterranean diet refers to food and lifestyle choices that are based on the traditions of countries located on the [employer]. This way of eating has been shown to help prevent certain conditions and improve outcomes for people who have chronic diseases, like kidney disease and heart disease. What are tips for following this plan? Lifestyle  Cook and eat meals together with your family, when possible.  Drink enough fluid to keep your urine clear or pale yellow.  Be physically active every day. This includes: ? Aerobic exercise like running or swimming. ? Leisure activities like gardening, walking, or housework.  Get 7-8 hours of sleep each night.  If recommended by your health care provider, drink red wine in moderation. This means 1 glass a day for nonpregnant women and 2 glasses a day for men. A glass of wine equals 5 oz (150 mL). Reading food labels  Check the serving size of packaged foods. For foods such as rice and pasta, the serving size refers to the amount of cooked product, not dry.  Check the total fat in packaged foods. Avoid foods that have saturated fat or trans fats.  Check the ingredients list for added sugars, such as corn syrup.   Shopping  At the grocery store, buy most of your food from the areas near the walls of the store. This includes: ? Fresh fruits and vegetables (produce). ? Grains, beans, nuts, and seeds. Some of these may be available in unpackaged forms or large amounts (in bulk). ? Fresh seafood. ? Poultry and eggs. ? Low-fat dairy products.  Buy whole ingredients instead of prepackaged foods.  Buy fresh fruits and vegetables in-season from local farmers markets.  Buy frozen fruits and vegetables in resealable bags.  If you do not have access to quality fresh seafood, buy precooked frozen shrimp or canned fish, such as tuna, salmon, or sardines.  Buy small amounts of raw or cooked vegetables, salads, or olives from the deli or  salad bar at your store.  Stock your pantry so you always have certain foods on hand, such as olive oil, canned tuna, canned tomatoes, rice, pasta, and beans. Cooking  Cook foods with extra-virgin olive oil instead of using butter or other vegetable oils.  Have meat as a side dish, and have vegetables or grains as your main dish. This means having meat in small portions or adding small amounts of meat to foods like pasta or stew.  Use beans or vegetables instead of meat in common dishes like chili or lasagna.  Experiment with different cooking methods. Try roasting or broiling vegetables instead of steaming or sauteing them.  Add frozen vegetables to soups, stews, pasta, or rice.  Add nuts or seeds for added healthy fat at each meal. You can add these to yogurt, salads, or vegetable dishes.  Marinate fish or vegetables using olive oil, lemon juice, garlic, and fresh herbs. Meal planning  Plan to eat 1 vegetarian meal one day each week. Try to work up to 2 vegetarian meals, if possible.  Eat seafood 2 or more times a week.  Have healthy snacks readily available, such as: ? Vegetable sticks with hummus. ? [place] yogurt. ? Fruit and nut trail mix.  Eat balanced meals throughout the week. This includes: ? Fruit: 2-3 servings a day ? Vegetables: 4-5 servings a day ? Low-fat dairy: 2 servings a day ? Fish, poultry, or lean meat: 1 serving a day ? Beans  and legumes: 2 or more servings a week ? Nuts and seeds: 1-2 servings a day ? Whole grains: 6-8 servings a day ? Extra-virgin olive oil: 3-4 servings a day  Limit red meat and sweets to only a few servings a month   What are my food choices?  Mediterranean diet ? Recommended  Grains: Whole-grain pasta. Brown rice. Bulgar wheat. Polenta. Couscous. Whole-wheat bread. [place].  Vegetables: Artichokes. Beets. Broccoli. Cabbage. Carrots. Eggplant. Green beans. Chard. Kale. Spinach. Onions. Leeks. Peas. Squash. Tomatoes.  Peppers. Radishes.  Fruits: Apples. Apricots. Avocado. Berries. Bananas. Cherries. Dates. Figs. Grapes. Lemons. Melon. Oranges. Peaches. Plums. Pomegranate.  Meats and other protein foods: Beans. Almonds. Sunflower seeds. Pine nuts. Peanuts. [place]. Salmon. Scallops. Shrimp. [place]. Tilapia. Clams. Oysters. Eggs.  Dairy: Low-fat milk. Cheese. Greek yogurt.  Beverages: Water. Red wine. Herbal tea.  Fats and oils: Extra virgin olive oil. Avocado oil. Grape seed oil.  Sweets and desserts: [place] yogurt with honey. Baked apples. Poached pears. Trail mix.  Seasoning and other foods: Basil. Cilantro. Coriander. Cumin. Mint. Parsley. Sage. Rosemary. Tarragon. Garlic. Oregano. Thyme. Pepper. Balsalmic vinegar. Tahini. Hummus. Tomato sauce. Olives. Mushrooms. ? Limit these  Grains: Prepackaged pasta or rice dishes. Prepackaged cereal with added sugar.  Vegetables: Deep fried potatoes (french fries).  Fruits: Fruit canned in syrup.  Meats and other protein foods: Beef. Pork. Lamb. Poultry with skin. Hot dogs. Berniece Salines.  Dairy: Ice cream. Sour cream. Whole milk.  Beverages: Juice. Sugar-sweetened soft drinks. Beer. Liquor and spirits.  Fats and oils: Butter. Canola oil. Vegetable oil. Beef fat (tallow). Lard.  Sweets and desserts: Cookies. Cakes. Pies. Candy.  Seasoning and other foods: Mayonnaise. Prem[place] sauces and marinades. The items listed may not be a complete list. Talk with your dietitian about what dietary choices are right for you. Summary  The Mediterranean diet includes both food and lifestyle choices.  Eat a variety of fresh fruits and vegetables, beans, nuts, seeds, and whole grains.  Limit the amount of red meat and sweets that you eat.  Talk with your health care provider about whether it is safe for you to drink red wine in moderation. This means 1 glass a day for nonpregnant women and 2 glasses a day for men. A glass of wine equals 5 oz (150 mL). This information is not  intended to replace advice given to you by your health care provider. Make sure you discuss any questions you have with your health care provider. Document Revised: 04/09/2016 Document Reviewed: 04/02/2016 Elsevier Patient Education  [place].  Cardiac CT Angiogram A cardiac CT angiogram is a procedure to look at the heart and the area around the heart. It may be done to help find the cause of chest pains or other symptoms of heart disease. During this procedure, a substance called contrast dye is injected into the blood vessels in the area to be checked. A large X-ray machine, called a CT scanner, then takes detailed pictures of the heart and the surrounding area. The procedure is also sometimes called a coronary CT angiogram, coronary artery scanning, or CTA. A cardiac CT angiogram allows the health care provider to see how well blood is flowing to and from the heart. The health care provider will be able to see if there are any problems, such as:  Blockage or narrowing of the coronary arteries in the heart.  Fluid around the heart.  Signs of weakness or disease in the muscles, valves, and tissues of the heart. Tell a  health care provider about:  Any allergies you have. This is especially important if you have had a previous allergic reaction to contrast dye.  All medicines you are taking, including vitamins, herbs, eye drops, creams, and over-the-counter medicines.  Any blood disorders you have.  Any surgeries you have had.  Any medical conditions you have.  Whether you are pregnant or may be pregnant.  Any anxiety disorders, chronic pain, or other conditions you have that may increase your stress or prevent you from lying still. What are the risks? Generally, this is a safe procedure. However, problems may occur, including:  Bleeding.  Infection.  Allergic reactions to medicines or dyes.  Damage to other structures or organs.  Kidney damage from the contrast dye  that is used.  Increased risk of cancer from radiation exposure. This risk is low. Talk with your health care provider about: ? The risks and benefits of testing. ? How you can receive the lowest dose of radiation. What happens before the procedure?  Wear comfortable clothing and remove any jewelry, glasses, dentures, and hearing aids.  Follow instructions from your health care provider about eating and drinking. This may include: ? For 12 hours before the procedure - avoid caffeine. This includes tea, coffee, soda, energy drinks, and diet pills. Drink plenty of water or other fluids that do not have caffeine in them. Being well hydrated can prevent complications. ? For 4-6 hours before the procedure - stop eating and drinking. The contrast dye can cause nausea, but this is less likely if your stomach is empty.  Ask your health care provider about changing or stopping your regular medicines. This is especially important if you are taking diabetes medicines, blood thinners, or medicines to treat problems with erections (erectile dysfunction). What happens during the procedure?  Hair on your chest may need to be removed so that small sticky patches called electrodes can be placed on your chest. These will transmit information that helps to monitor your heart during the procedure.  An IV will be inserted into one of your veins.  You might be given a medicine to control your heart rate during the procedure. This will help to ensure that good images are obtained.  You will be asked to lie on an exam table. This table will slide in and out of the CT machine during the procedure.  Contrast dye will be injected into the IV. You might feel warm, or you may get a metallic taste in your mouth.  You will be given a medicine called nitroglycerin. This will relax or dilate the arteries in your heart.  The table that you are lying on will move into the CT machine tunnel for the scan.  The person  running the machine will give you instructions while the scans are being done. You may be asked to: ? Keep your arms above your head. ? Hold your breath. ? Stay very still, even if the table is moving.  When the scanning is complete, you will be moved out of the machine.  The IV will be removed. The procedure may vary among health care providers and hospitals.   What can I expect after the procedure? After your procedure, it is common to have:  A metallic taste in your mouth from the contrast dye.  A feeling of warmth.  A headache from the nitroglycerin. Follow these instructions at home:  Take over-the-counter and prescription medicines only as told by your health care provider.  If you are  told, drink enough fluid to keep your urine pale yellow. This will help to flush the contrast dye out of your body.  Most people can return to their normal activities right after the procedure. Ask your health care provider what activities are safe for you.  It is up to you to get the results of your procedure. Ask your health care provider, or the department that is doing the procedure, when your results will be ready.  Keep all follow-up visits as told by your health care provider. This is important. Contact a health care provider if:  You have any symptoms of allergy to the contrast dye. These include: ? Shortness of breath. ? Rash or hives. ? A racing heartbeat. Summary  A cardiac CT angiogram is a procedure to look at the heart and the area around the heart. It may be done to help find the cause of chest pains or other symptoms of heart disease.  During this procedure, a large X-ray machine, called a CT scanner, takes detailed pictures of the heart and the surrounding area after a contrast dye has been injected into blood vessels in the area.  Ask your health care provider about changing or stopping your regular medicines before the procedure. This is especially important if you are  taking diabetes medicines, blood thinners, or medicines to treat erectile dysfunction.  If you are told, drink enough fluid to keep your urine pale yellow. This will help to flush the contrast dye out of your body. This information is not intended to replace advice given to you by your health care provider. Make sure you discuss any questions you have with your health care provider. Document Revised: 04/05/2019 Document Reviewed: 04/05/2019 Elsevier Patient Education  2021 [place].  Echocardiogram An echocardiogram is a test that uses sound waves (ultrasound) to produce images of the heart. Images from an echocardiogram can provide important information about:  Heart size and shape.  The size and thickness and movement of your heart's walls.  Heart muscle function and strength.  Heart valve function or if you have stenosis. Stenosis is when the heart valves are too narrow.  If blood is flowing backward through the heart valves (regurgitation).  A tumor or infectious growth around the heart valves.  Areas of heart muscle that are not working well because of poor blood flow or injury from a heart attack.  Aneurysm detection. An aneurysm is a weak or damaged part of an artery wall. The wall bulges out from the normal force of blood pumping through the body. Tell a health care provider about:  Any allergies you have.  All medicines you are taking, including vitamins, herbs, eye drops, creams, and over-the-counter medicines.  Any blood disorders you have.  Any surgeries you have had.  Any medical conditions you have.  Whether you are pregnant or may be pregnant. What are the risks? Generally, this is a safe test. However, problems may occur, including an allergic reaction to dye (contrast) that may be used during the test. What happens before the test? No specific preparation is needed. You may eat and drink normally. What happens during the test?  You will take off your  clothes from the waist up and put on a hospital gown.  Electrodes or electrocardiogram (ECG)patches may be placed on your chest. The electrodes or patches are then connected to a device that monitors your heart rate and rhythm.  You will lie down on a table for an ultrasound exam. A gel  will be applied to your chest to help sound waves pass through your skin.  A handheld device, called a transducer, will be pressed against your chest and moved over your heart. The transducer produces sound waves that travel to your heart and bounce back (or "echo" back) to the transducer. These sound waves will be captured in real-time and changed into images of your heart that can be viewed on a video monitor. The images will be recorded on a computer and reviewed by your health care provider.  You may be asked to change positions or hold your breath for a short time. This makes it easier to get different views or better views of your heart.  In some cases, you may receive contrast through an IV in one of your veins. This can improve the quality of the pictures from your heart. The procedure may vary among health care providers and hospitals.   What can I expect after the test? You may return to your normal, everyday life, including diet, activities, and medicines, unless your health care provider tells you not to do that. Follow these instructions at home:  It is up to you to get the results of your test. Ask your health care provider, or the department that is doing the test, when your results will be ready.  Keep all follow-up visits. This is important. Summary  An echocardiogram is a test that uses sound waves (ultrasound) to produce images of the heart.  Images from an echocardiogram can provide important information about the size and shape of your heart, heart muscle function, heart valve function, and other possible heart problems.  You do not need to do anything to prepare before this test. You may  eat and drink normally.  After the echocardiogram is completed, you may return to your normal, everyday life, unless your health care provider tells you not to do that. This information is not intended to replace advice given to you by your health care provider. Make sure you discuss any questions you have with your health care provider. Document Revised: 04/02/2020 Document Reviewed: 04/02/2020 Elsevier Patient Education  2021 [place].      Adopting a Healthy Lifestyle.  Know what a healthy weight is for you (roughly BMI <25) and aim to maintain this   Aim for 7+ servings of fruits and vegetables daily   65-80+ fluid ounces of water or unsweet tea for healthy kidneys   Limit to max 1 drink of alcohol per day; avoid smoking/tobacco   Limit animal fats in diet for cholesterol and heart health - choose grass fed whenever available   Avoid highly processed foods, and foods high in saturated/trans fats   Aim for low stress - take time to unwind and care for your mental health   Aim for 150 min of moderate intensity exercise weekly for heart health, and weights twice weekly for bone health   Aim for 7-9 hours of sleep daily   When it comes to diets, agreement about the perfect plan isnt easy to find, even among the experts. Experts at the [employer] developed an idea known as the Healthy Eating Plate. Just imagine a plate divided into logical, healthy portions.   The emphasis is on diet quality:   Load up on vegetables and fruits - one-half of your plate: Aim for color and variety, and remember that potatoes dont count.   Go for whole grains - one-quarter of your plate: Whole wheat, barley, wheat  berries, quinoa, oats, brown rice, and foods made with them. If you want pasta, go with whole wheat pasta.   Protein power - one-quarter of your plate: Fish, chicken, beans, and nuts are all healthy, versatile protein sources. Limit red meat.   The diet,  however, does go beyond the plate, offering a few other suggestions.   Use healthy plant oils, such as olive, canola, soy, corn, sunflower and peanut. Check the labels, and avoid partially hydrogenated oil, which have unhealthy trans fats.   If youre thirsty, drink water. Coffee and tea are good in moderation, but skip sugary drinks and limit milk and dairy products to one or two daily servings.   The type of carbohydrate in the diet is more important than the amount. Some sources of carbohydrates, such as vegetables, fruits, whole grains, and beans-are healthier than others.   Finally, stay active  Signed, Berniece Salines, DO  09/23/2020 11:28 AM    [facility]

## 2020-09-23 NOTE — Patient Instructions (Signed)
Medication Instructions:  Your physician has recommended you make the following change in your medication:  START: Hydrochlorothiazide 12.5 mg daily START: Lipitor 10 mg daily  *If you need a refill on your cardiac medications before your next appointment, please call your pharmacy*   Lab Work: Your physician recommends that you return for lab work: TODAY BMET, CBC, TSH  If you have labs (blood work) drawn today and your tests are completely normal, you will receive your results only by: Marland Kitchen MyChart Message (if you have MyChart) OR . A paper copy in the mail If you have any lab test that is abnormal or we need to change your treatment, we will call you to review the results.   Testing/Procedures: Your physician has requested that you have an echocardiogram. Echocardiography is a painless test that uses sound waves to create images of your heart. It provides your doctor with information about the size and shape of your heart and how well your heart's chambers and valves are working. This procedure takes approximately one hour. There are no restrictions for this procedure.  Your cardiac CT will be scheduled at :  [facility] [address] [place], [place] [address] [phone]    [facility], please arrive at the [facility] main entrance of [facility] 30 minutes prior to test start time. Proceed to the [facility] Radiology Department (first floor) to check-in and test prep.   Please follow these instructions carefully (unless otherwise directed):   On the Night Before the Test: . Be sure to Drink plenty of water. . Do not consume any caffeinated/decaffeinated beverages or chocolate 12 hours prior to your test. . Do not take any antihistamines 12 hours prior to your test.  On the Day of the Test: . Drink plenty of water. Do not drink any water within one hour of the test. . Do not eat any food 4 hours prior to the test. . You may take your  regular medications prior to the test.  . Take Ivabradine two hours prior to test. . HOLD Hydrochlorothiazide morning of the test. . FEMALES- please wear underwire-free bra if available        After the Test: . Drink plenty of water. . After receiving IV contrast, you may experience a mild flushed feeling. This is normal. . On occasion, you may experience a mild rash up to 24 hours after the test. This is not dangerous. If this occurs, you can take Benadryl 25 mg and increase your fluid intake. . If you experience trouble breathing, this can be serious. If it is severe call 911 IMMEDIATELY. If it is mild, please call our office. . If you take any of these medications: Glipizide/Metformin, Avandament, Glucavance, please do not take 48 hours after completing test unless otherwise instructed.   Once we have confirmed authorization from your insurance company, we will call you to set up a date and time for your test. Based on how quickly your insurance processes prior authorizations requests, please allow up to 4 weeks to be contacted for scheduling your Cardiac CT appointment. Be advised that routine Cardiac CT appointments could be scheduled as many as 8 weeks after your provider has ordered it.  For non-scheduling related questions, please contact the cardiac imaging nurse navigator should you have any questions/concerns: Marchia Bond, Cardiac Imaging Nurse Navigator Burley Saver, Interim Cardiac Imaging Nurse [place] [facility] Direct Office Dial: [phone]   For scheduling needs, including  cancellations and rescheduling, please call [place], [phone].     Follow-Up: At [facility], you and your health needs are our priority.  As part of our continuing mission to provide you with exceptional heart care, we have created designated Provider Care Teams.  These Care Teams include your primary Cardiologist (physician) and Advanced Practice Providers (APPs  -  Physician Assistants and Nurse Practitioners) who all work together to provide you with the care you need, when you need it.  We recommend signing up for the patient portal called "MyChart".  Sign up information is provided on this After Visit Summary.  MyChart is used to connect with patients for Virtual Visits (Telemedicine).  Patients are able to view lab/test results, encounter notes, upcoming appointments, etc.  Non-urgent messages can be sent to your provider as well.   To learn more about what you can do with MyChart, go to [email or web address].    Your next appointment:   3 month(s)  The format for your next appointment:   In Person  Provider:   Berniece Salines, DO   Other Instructions  Mediterranean Diet A Mediterranean diet refers to food and lifestyle choices that are based on the traditions of countries located on the [place]. This way of eating has been shown to help prevent certain conditions and improve outcomes for people who have chronic diseases, like kidney disease and heart disease. What are tips for following this plan? Lifestyle  Cook and eat meals together with your family, when possible.  Drink enough fluid to keep your urine clear or pale yellow.  Be physically active every day. This includes: ? Aerobic exercise like running or swimming. ? Leisure activities like gardening, walking, or housework.  Get 7-8 hours of sleep each night.  If recommended by your health care provider, drink red wine in moderation. This means 1 glass a day for nonpregnant women and 2 glasses a day for men. A glass of wine equals 5 oz (150 mL). Reading food labels  Check the serving size of packaged foods. For foods such as rice and pasta, the serving size refers to the amount of cooked product, not dry.  Check the total fat in packaged foods. Avoid foods that have saturated fat or trans fats.  Check the ingredients list for added sugars, such as corn syrup.    Shopping  At the grocery store, buy most of your food from the areas near the walls of the store. This includes: ? Fresh fruits and vegetables (produce). ? Grains, beans, nuts, and seeds. Some of these may be available in unpackaged forms or large amounts (in bulk). ? Fresh seafood. ? Poultry and eggs. ? Low-fat dairy products.  Buy whole ingredients instead of prepackaged foods.  Buy fresh fruits and vegetables in-season from local farmers markets.  Buy frozen fruits and vegetables in resealable bags.  If you do not have access to quality fresh seafood, buy precooked frozen shrimp or canned fish, such as tuna, salmon, or sardines.  Buy small amounts of raw or cooked vegetables, salads, or olives from the deli or salad bar at your store.  Stock your pantry so you always have certain foods on hand, such as olive oil, canned tuna, canned tomatoes, rice, pasta, and beans. Cooking  Cook foods with extra-virgin olive oil instead of using butter or other vegetable oils.  Have meat as a side dish, and have vegetables or grains as your main dish. This means having meat in small portions or adding small amounts  of meat to foods like pasta or stew.  Use beans or vegetables instead of meat in common dishes like chili or lasagna.  Experiment with different cooking methods. Try roasting or broiling vegetables instead of steaming or sauteing them.  Add frozen vegetables to soups, stews, pasta, or rice.  Add nuts or seeds for added healthy fat at each meal. You can add these to yogurt, salads, or vegetable dishes.  Marinate fish or vegetables using olive oil, lemon juice, garlic, and fresh herbs. Meal planning  Plan to eat 1 vegetarian meal one day each week. Try to work up to 2 vegetarian meals, if possible.  Eat seafood 2 or more times a week.  Have healthy snacks readily available, such as: ? Vegetable sticks with hummus. ? [place] yogurt. ? Fruit and nut trail mix.  Eat balanced  meals throughout the week. This includes: ? Fruit: 2-3 servings a day ? Vegetables: 4-5 servings a day ? Low-fat dairy: 2 servings a day ? Fish, poultry, or lean meat: 1 serving a day ? Beans and legumes: 2 or more servings a week ? Nuts and seeds: 1-2 servings a day ? Whole grains: 6-8 servings a day ? Extra-virgin olive oil: 3-4 servings a day  Limit red meat and sweets to only a few servings a month   What are my food choices?  Mediterranean diet ? Recommended  Grains: Whole-grain pasta. Brown rice. Bulgar wheat. Polenta. Couscous. Whole-wheat bread. [place].  Vegetables: Artichokes. Beets. Broccoli. Cabbage. Carrots. Eggplant. Green beans. Chard. Kale. Spinach. Onions. Leeks. Peas. Squash. Tomatoes. Peppers. Radishes.  Fruits: Apples. Apricots. Avocado. Berries. Bananas. Cherries. Dates. Figs. Grapes. Lemons. Melon. Oranges. Peaches. Plums. Pomegranate.  Meats and other protein foods: Beans. Almonds. Sunflower seeds. Pine nuts. Peanuts. [place]. Salmon. Scallops. Shrimp. [place]. Tilapia. Clams. Oysters. Eggs.  Dairy: Low-fat milk. Cheese. Greek yogurt.  Beverages: Water. Red wine. Herbal tea.  Fats and oils: Extra virgin olive oil. Avocado oil. Grape seed oil.  Sweets and desserts: [place] yogurt with honey. Baked apples. Poached pears. Trail mix.  Seasoning and other foods: Basil. Cilantro. Coriander. Cumin. Mint. Parsley. Sage. Rosemary. Tarragon. Garlic. Oregano. Thyme. Pepper. Balsalmic vinegar. Tahini. Hummus. Tomato sauce. Olives. Mushrooms. ? Limit these  Grains: Prepackaged pasta or rice dishes. Prepackaged cereal with added sugar.  Vegetables: Deep fried potatoes (french fries).  Fruits: Fruit canned in syrup.  Meats and other protein foods: Beef. Pork. Lamb. Poultry with skin. Hot dogs. Berniece Salines.  Dairy: Ice cream. Sour cream. Whole milk.  Beverages: Juice. Sugar-sweetened soft drinks. Beer. Liquor and spirits.  Fats and oils: Butter. Canola oil. Vegetable  oil. Beef fat (tallow). Lard.  Sweets and desserts: Cookies. Cakes. Pies. Candy.  Seasoning and other foods: Mayonnaise. Premade sauces and marinades. The items listed may not be a complete list. Talk with your dietitian about what dietary choices are right for you. Summary  The Mediterranean diet includes both food and lifestyle choices.  Eat a variety of fresh fruits and vegetables, beans, nuts, seeds, and whole grains.  Limit the amount of red meat and sweets that you eat.  Talk with your health care provider about whether it is safe for you to drink red wine in moderation. This means 1 glass a day for nonpregnant women and 2 glasses a day for men. A glass of wine equals 5 oz (150 mL). This information is not intended to replace advice given to you by your health care provider. Make sure you discuss any questions you have with your  health care provider. Document Revised: 04/09/2016 Document Reviewed: 04/02/2016 Elsevier Patient Education  [place].  Cardiac CT Angiogram A cardiac CT angiogram is a procedure to look at the heart and the area around the heart. It may be done to help find the cause of chest pains or other symptoms of heart disease. During this procedure, a substance called contrast dye is injected into the blood vessels in the area to be checked. A large X-ray machine, called a CT scanner, then takes detailed pictures of the heart and the surrounding area. The procedure is also sometimes called a coronary CT angiogram, coronary artery scanning, or CTA. A cardiac CT angiogram allows the health care provider to see how well blood is flowing to and from the heart. The health care provider will be able to see if there are any problems, such as:  Blockage or narrowing of the coronary arteries in the heart.  Fluid around the heart.  Signs of weakness or disease in the muscles, valves, and tissues of the heart. Tell a health care provider about:  Any allergies you  have. This is especially important if you have had a previous allergic reaction to contrast dye.  All medicines you are taking, including vitamins, herbs, eye drops, creams, and over-the-counter medicines.  Any blood disorders you have.  Any surgeries you have had.  Any medical conditions you have.  Whether you are pregnant or may be pregnant.  Any anxiety disorders, chronic pain, or other conditions you have that may increase your stress or prevent you from lying still. What are the risks? Generally, this is a safe procedure. However, problems may occur, including:  Bleeding.  Infection.  Allergic reactions to medicines or dyes.  Damage to other structures or organs.  Kidney damage from the contrast dye that is used.  Increased risk of cancer from radiation exposure. This risk is low. Talk with your health care provider about: ? The risks and benefits of testing. ? How you can receive the lowest dose of radiation. What happens before the procedure?  Wear comfortable clothing and remove any jewelry, glasses, dentures, and hearing aids.  Follow instructions from your health care provider about eating and drinking. This may include: ? For 12 hours before the procedure -- avoid caffeine. This includes tea, coffee, soda, energy drinks, and diet pills. Drink plenty of water or other fluids that do not have caffeine in them. Being well hydrated can prevent complications. ? For 4-6 hours before the procedure -- stop eating and drinking. The contrast dye can cause nausea, but this is less likely if your stomach is empty.  Ask your health care provider about changing or stopping your regular medicines. This is especially important if you are taking diabetes medicines, blood thinners, or medicines to treat problems with erections (erectile dysfunction). What happens during the procedure?  Hair on your chest may need to be removed so that small sticky patches called electrodes can be  placed on your chest. These will transmit information that helps to monitor your heart during the procedure.  An IV will be inserted into one of your veins.  You might be given a medicine to control your heart rate during the procedure. This will help to ensure that good images are obtained.  You will be asked to lie on an exam table. This table will slide in and out of the CT machine during the procedure.  Contrast dye will be injected into the IV. You might feel warm, or  you may get a metallic taste in your mouth.  You will be given a medicine called nitroglycerin. This will relax or dilate the arteries in your heart.  The table that you are lying on will move into the CT machine tunnel for the scan.  The person running the machine will give you instructions while the scans are being done. You may be asked to: ? Keep your arms above your head. ? Hold your breath. ? Stay very still, even if the table is moving.  When the scanning is complete, you will be moved out of the machine.  The IV will be removed. The procedure may vary among health care providers and hospitals.   What can I expect after the procedure? After your procedure, it is common to have:  A metallic taste in your mouth from the contrast dye.  A feeling of warmth.  A headache from the nitroglycerin. Follow these instructions at home:  Take over-the-counter and prescription medicines only as told by your health care provider.  If you are told, drink enough fluid to keep your urine pale yellow. This will help to flush the contrast dye out of your body.  Most people can return to their normal activities right after the procedure. Ask your health care provider what activities are safe for you.  It is up to you to get the results of your procedure. Ask your health care provider, or the department that is doing the procedure, when your results will be ready.  Keep all follow-up visits as told by your health care  provider. This is important. Contact a health care provider if:  You have any symptoms of allergy to the contrast dye. These include: ? Shortness of breath. ? Rash or hives. ? A racing heartbeat. Summary  A cardiac CT angiogram is a procedure to look at the heart and the area around the heart. It may be done to help find the cause of chest pains or other symptoms of heart disease.  During this procedure, a large X-ray machine, called a CT scanner, takes detailed pictures of the heart and the surrounding area after a contrast dye has been injected into blood vessels in the area.  Ask your health care provider about changing or stopping your regular medicines before the procedure. This is especially important if you are taking diabetes medicines, blood thinners, or medicines to treat erectile dysfunction.  If you are told, drink enough fluid to keep your urine pale yellow. This will help to flush the contrast dye out of your body. This information is not intended to replace advice given to you by your health care provider. Make sure you discuss any questions you have with your health care provider. Document Revised: 04/05/2019 Document Reviewed: 04/05/2019 Elsevier Patient Education  2021 [place].  Echocardiogram An echocardiogram is a test that uses sound waves (ultrasound) to produce images of the heart. Images from an echocardiogram can provide important information about:  Heart size and shape.  The size and thickness and movement of your heart's walls.  Heart muscle function and strength.  Heart valve function or if you have stenosis. Stenosis is when the heart valves are too narrow.  If blood is flowing backward through the heart valves (regurgitation).  A tumor or infectious growth around the heart valves.  Areas of heart muscle that are not working well because of poor blood flow or injury from a heart attack.  Aneurysm detection. An aneurysm is a weak or damaged  part of an artery wall. The wall bulges out from the normal force of blood pumping through the body. Tell a health care provider about:  Any allergies you have.  All medicines you are taking, including vitamins, herbs, eye drops, creams, and over-the-counter medicines.  Any blood disorders you have.  Any surgeries you have had.  Any medical conditions you have.  Whether you are pregnant or may be pregnant. What are the risks? Generally, this is a safe test. However, problems may occur, including an allergic reaction to dye (contrast) that may be used during the test. What happens before the test? No specific preparation is needed. You may eat and drink normally. What happens during the test?  You will take off your clothes from the waist up and put on a hospital gown.  Electrodes or electrocardiogram (ECG)patches may be placed on your chest. The electrodes or patches are then connected to a device that monitors your heart rate and rhythm.  You will lie down on a table for an ultrasound exam. A gel will be applied to your chest to help sound waves pass through your skin.  A handheld device, called a transducer, will be pressed against your chest and moved over your heart. The transducer produces sound waves that travel to your heart and bounce back (or "echo" back) to the transducer. These sound waves will be captured in real-time and changed into images of your heart that can be viewed on a video monitor. The images will be recorded on a computer and reviewed by your health care provider.  You may be asked to change positions or hold your breath for a short time. This makes it easier to get different views or better views of your heart.  In some cases, you may receive contrast through an IV in one of your veins. This can improve the quality of the pictures from your heart. The procedure may vary among health care providers and hospitals.   What can I expect after the test? You may  return to your normal, everyday life, including diet, activities, and medicines, unless your health care provider tells you not to do that. Follow these instructions at home:  It is up to you to get the results of your test. Ask your health care provider, or the department that is doing the test, when your results will be ready.  Keep all follow-up visits. This is important. Summary  An echocardiogram is a test that uses sound waves (ultrasound) to produce images of the heart.  Images from an echocardiogram can provide important information about the size and shape of your heart, heart muscle function, heart valve function, and other possible heart problems.  You do not need to do anything to prepare before this test. You may eat and drink normally.  After the echocardiogram is completed, you may return to your normal, everyday life, unless your health care provider tells you not to do that. This information is not intended to replace advice given to you by your health care provider. Make sure you discuss any questions you have with your health care provider. Document Revised: 04/02/2020 Document Reviewed: 04/02/2020 Elsevier Patient Education  2021 [employer].

## 2020-09-24 LAB — CBC WITH DIFFERENTIAL/PLATELET
Basophils Absolute: 0 10*3/uL (ref 0.0–0.2)
Basos: 1 %
EOS (ABSOLUTE): 0.1 10*3/uL (ref 0.0–0.4)
Eos: 2 %
Hematocrit: 38.2 % (ref 34.0–46.6)
Hemoglobin: 12.2 g/dL (ref 11.1–15.9)
Immature Grans (Abs): 0 10*3/uL (ref 0.0–0.1)
Immature Granulocytes: 0 %
Lymphocytes Absolute: 1 10*3/uL (ref 0.7–3.1)
Lymphs: 26 %
MCH: 29.8 pg (ref 26.6–33.0)
MCHC: 31.9 g/dL (ref 31.5–35.7)
MCV: 93 fL (ref 79–97)
Monocytes Absolute: 0.3 10*3/uL (ref 0.1–0.9)
Monocytes: 8 %
Neutrophils Absolute: 2.6 10*3/uL (ref 1.4–7.0)
Neutrophils: 63 %
Platelets: 240 10*3/uL (ref 150–450)
RBC: 4.09 x10E6/uL (ref 3.77–5.28)
RDW: 12.8 % (ref 11.7–15.4)
WBC: 4.1 10*3/uL (ref 3.4–10.8)

## 2020-09-24 LAB — BASIC METABOLIC PANEL
BUN/Creatinine Ratio: 25 (ref 12–28)
BUN: 19 mg/dL (ref 8–27)
CO2: 24 mmol/L (ref 20–29)
Calcium: 9.1 mg/dL (ref 8.7–10.3)
Chloride: 110 mmol/L — ABNORMAL HIGH (ref 96–106)
Creatinine, Ser: 0.75 mg/dL (ref 0.57–1.00)
GFR calc Af Amer: 97 mL/min/{1.73_m2} (ref >59)
GFR calc non Af Amer: 85 mL/min/{1.73_m2} (ref >59)
Glucose: 100 mg/dL — ABNORMAL HIGH (ref 65–99)
Potassium: 4.4 mmol/L (ref 3.5–5.2)
Sodium: 147 mmol/L — ABNORMAL HIGH (ref 134–144)

## 2020-10-03 ENCOUNTER — Telehealth (HOSPITAL_COMMUNITY): Payer: Self-pay | Admitting: Emergency Medicine

## 2020-10-03 NOTE — Telephone Encounter (Signed)
Reaching out to patient to offer assistance regarding upcoming cardiac imaging study; pt verbalizes understanding of appt date/time, parking situation and where to check in, pre-test NPO status and medications ordered, and verified current allergies; name and call back number provided for further questions should they arise Marchia Bond RN Navigator Cardiac Imaging [facility] [phone] office [phone] cell  Pt to take 5mg  ivabradine 2 hr prior to scan Konstantine Gervasi

## 2020-10-07 ENCOUNTER — Other Ambulatory Visit: Payer: Self-pay

## 2020-10-07 ENCOUNTER — Ambulatory Visit (HOSPITAL_COMMUNITY)
Admission: RE | Admit: 2020-10-07 | Discharge: 2020-10-07 | Disposition: A | Discharge status: Home / Self Care | Payer: Federal, State, Local not specified - PPO | Source: Ambulatory Visit | Attending: Cardiology | Admitting: Cardiology

## 2020-10-07 DIAGNOSIS — R079 Chest pain, unspecified: Secondary | ICD-10-CM | POA: Insufficient documentation

## 2020-10-07 DIAGNOSIS — Z006 Encounter for examination for normal comparison and control in clinical research program: Secondary | ICD-10-CM

## 2020-10-07 DIAGNOSIS — I2510 Atherosclerotic heart disease of native coronary artery without angina pectoris: Secondary | ICD-10-CM | POA: Insufficient documentation

## 2020-10-07 MED ORDER — NITROGLYCERIN 0.4 MG SL SUBL
0.8000 mg | SUBLINGUAL_TABLET | Freq: Once | SUBLINGUAL | Status: AC
  Administered 2020-10-07: 0.8 mg via SUBLINGUAL

## 2020-10-07 MED ORDER — NITROGLYCERIN 0.4 MG SL SUBL
SUBLINGUAL_TABLET | SUBLINGUAL | Status: AC
  Filled 2020-10-07: qty 2

## 2020-10-07 MED ORDER — IOHEXOL 350 MG/ML SOLN
80.0000 mL | Freq: Once | INTRAVENOUS | Status: AC | PRN
  Administered 2020-10-07: 80 mL via INTRAVENOUS

## 2020-10-07 NOTE — Research (Signed)
IDENTIFY Informed Consent                  Subject Name: Alexa Rodriguez    Subject met inclusion and exclusion criteria.  The informed consent form, study requirements and expectations were reviewed with the subject and questions and concerns were addressed prior to the signing of the consent form.  The subject verbalized understanding of the trial requirements.  The subject agreed to participate in the IDENTIFY trial and signed the informed consent at 12:07PM on 10/07/20.  The informed consent was obtained prior to performance of any protocol-specific procedures for the subject.  A copy of the signed informed consent was given to the subject and a copy was placed in the subject's medical record.   Meade Maw, Naval architect

## 2020-10-18 ENCOUNTER — Telehealth: Payer: Self-pay | Admitting: Cardiology

## 2020-10-18 NOTE — Telephone Encounter (Signed)
New Message:     Pt wants to know if she still need to have her Echo on Monday(10-21-20)

## 2020-10-18 NOTE — Telephone Encounter (Signed)
Spoke with patient about her echo Monday. Patient given time that she needs to arrive.

## 2020-10-21 ENCOUNTER — Ambulatory Visit (INDEPENDENT_AMBULATORY_CARE_PROVIDER_SITE_OTHER): Payer: Federal, State, Local not specified - PPO | Admitting: Student

## 2020-10-21 ENCOUNTER — Ambulatory Visit (INDEPENDENT_AMBULATORY_CARE_PROVIDER_SITE_OTHER): Payer: Federal, State, Local not specified - PPO

## 2020-10-21 ENCOUNTER — Other Ambulatory Visit: Payer: Self-pay

## 2020-10-21 ENCOUNTER — Encounter: Payer: Self-pay | Admitting: Student

## 2020-10-21 DIAGNOSIS — G4700 Insomnia, unspecified: Secondary | ICD-10-CM

## 2020-10-21 DIAGNOSIS — I10 Essential (primary) hypertension: Secondary | ICD-10-CM

## 2020-10-21 DIAGNOSIS — R011 Cardiac murmur, unspecified: Secondary | ICD-10-CM | POA: Diagnosis not present

## 2020-10-21 HISTORY — DX: Insomnia, unspecified: G47.00

## 2020-10-21 LAB — ECHOCARDIOGRAM COMPLETE
Area-P 1/2: 4.06 cm2
S' Lateral: 2.5 cm

## 2020-10-21 MED ORDER — OLMESARTAN MEDOXOMIL 20 MG PO TABS: 10.0000 mg | ORAL_TABLET | Freq: Every day | ORAL | 1 refills | Status: DC

## 2020-10-21 MED ORDER — LIDOCAINE 5 % EX PTCH: 1.0000 | MEDICATED_PATCH | Freq: Two times a day (BID) | CUTANEOUS | 1 refills | Status: DC

## 2020-10-21 MED ORDER — RAMELTEON 8 MG PO TABS: 8.0000 mg | ORAL_TABLET | Freq: Every day | ORAL | 2 refills | Status: DC

## 2020-10-21 NOTE — Patient Instructions (Signed)
Alexa Rodriguez,  It was a pleasure seeing you in the clinic today.   We discussed the following:  1. I have added a new blood pressure medication called Olmesartan. Please take it as directed.  2. I have prescribed a sleeping medication called Ramelteon to help with your insomnia.  3. I have refilled your lidocaine patches.  Please call our clinic at [phone] if you have any questions or concerns. The best time to call is Monday-Friday from 9am-4pm, but there is someone available 24/7 at the same number. If you need medication refills, please notify your pharmacy one week in advance and they will send [place] a request.   Thank you for letting us take part in your care. We look forward to seeing you next time!

## 2020-10-21 NOTE — Progress Notes (Signed)
Complete echocardiogram performed.  Jimmy Jackelyne Sayer RDCS, RVT  

## 2020-10-21 NOTE — Progress Notes (Signed)
   CC: insomnia  HPI:  [name] is a 65 y.o. female with history listed below presenting to the [facility] for insomnia. Please see individualized problem based charting for full HPI.  Past Medical History:  Diagnosis Date  . ADHD 01/30/2020  . Bradycardia on ECG 01/30/2020  . Depression 01/30/2020  . Fibromyalgia 01/30/2020  . Healthcare maintenance 08/11/2020  . OSA (obstructive sleep apnea) 01/30/2020  . Stable angina ([place]) 08/09/2020    Review of Systems:  Negative aside from that listed in individualized problem based charting.  Physical Exam:  Vitals:   10/21/20 1526 10/21/20 1529  BP: (!) 145/78 138/81  Pulse: 70 67  Temp: 98.1 F (36.7 C)   TempSrc: Oral   SpO2: 98%   Weight: 201 lb 6.4 oz (91.4 kg)   Height: 5\' 3"  (1.6 m)    Physical Exam Constitutional:      Appearance: She is obese. She is not ill-appearing.  HENT:     Head: Normocephalic and atraumatic.     Nose: Nose normal. No congestion.     Mouth/Throat:     Mouth: Mucous membranes are moist.     Pharynx: Oropharynx is clear. No oropharyngeal exudate.  Eyes:     Extraocular Movements: Extraocular movements intact.     Conjunctiva/sclera: Conjunctivae normal.     Pupils: Pupils are equal, round, and reactive to light.  Cardiovascular:     Rate and Rhythm: Normal rate and regular rhythm.     Pulses: Normal pulses.     Heart sounds: Normal heart sounds. No murmur heard. No friction rub. No gallop.   Pulmonary:     Effort: Pulmonary effort is normal.     Breath sounds: Normal breath sounds. No wheezing, rhonchi or rales.  Abdominal:     General: Bowel sounds are normal. There is no distension.     Palpations: Abdomen is soft.     Tenderness: There is no abdominal tenderness.  Musculoskeletal:        General: No swelling. Normal range of motion.     Cervical back: Normal range of motion.  Skin:    General: Skin is warm and dry.  Neurological:     General: No focal deficit present.     Mental  Status: She is alert and oriented to person, place, and time.  Psychiatric:        Mood and Affect: Mood normal.        Behavior: Behavior normal.      Assessment & Plan:   See Encounters Tab for problem based charting.  Patient discussed with Dr. Heber [place]'

## 2020-10-21 NOTE — Assessment & Plan Note (Signed)
Patient complaining of insomnia that has been ongoing for a while now. She states that she has difficulty falling asleep and when she does fall asleep she cannot maintain staying asleep. 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 I counseled patient on sleep hygiene, which she states she needs to improve. She has also tried melatonin, which worked initially but has recently not been helping her. Discussed trying ramelteon 8mg  qhs as a sleep aid. She agrees with plan.  Plan: -start ramelteon 8mg  qhs

## 2020-10-21 NOTE — Assessment & Plan Note (Addendum)
Today's Vitals   10/21/20 1526 10/21/20 1529  BP: (!) 145/78 138/81  Pulse: 70 67  Temp: 98.1 F (36.7 C)   TempSrc: Oral   SpO2: 98%   Weight: 201 lb 6.4 oz (91.4 kg)   Height: 5\' 3"  (1.6 m)   PainSc: 4  8    Body mass index is 35.68 kg/m.  Patient with history of HTN, on HCTZ 12.5mg  daily. BP today 145/78 with repeat 138/81. Patient reports that she feels that her BP is at times high at home, although she does not have any symptoms. 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 Discussed benefits of controlling her BP especially in the context of her obesity and prediabetes. Discussed adding olmesartan 10mg  to her regimen to help control BP, which patient agreed with. Plan to eventually combine HCTZ and olmesartan to Benicar for convenience.  Plan: -continue HCTZ -start olmesartan -continue lifestyle modifications (diet and exercise) -plan to eventually transition to combo therapy with Benicar -should BP remain elevated after addition of olmesartan, consider possibility of OSA being cause of HTN and consider CPAP qhs

## 2020-10-25 NOTE — Progress Notes (Signed)
Internal Medicine Clinic Attending  Case discussed with Dr. Jinwala  At the time of the visit.  We reviewed the resident's history and exam and pertinent patient test results.  I agree with the assessment, diagnosis, and plan of care documented in the resident's note.  

## 2020-11-07 DIAGNOSIS — M7582 Other shoulder lesions, left shoulder: Secondary | ICD-10-CM | POA: Diagnosis not present

## 2020-11-07 DIAGNOSIS — M1731 Unilateral post-traumatic osteoarthritis, right knee: Secondary | ICD-10-CM | POA: Diagnosis not present

## 2020-12-05 ENCOUNTER — Other Ambulatory Visit (HOSPITAL_COMMUNITY): Payer: Self-pay

## 2020-12-13 ENCOUNTER — Other Ambulatory Visit: Payer: Self-pay | Admitting: Internal Medicine

## 2020-12-13 DIAGNOSIS — M797 Fibromyalgia: Secondary | ICD-10-CM

## 2020-12-16 ENCOUNTER — Other Ambulatory Visit: Payer: Self-pay | Admitting: Student

## 2020-12-16 DIAGNOSIS — M797 Fibromyalgia: Secondary | ICD-10-CM

## 2020-12-16 NOTE — Telephone Encounter (Signed)
[facility]LL PHARMACY REQUEST CHANGE DUE TO INS  Per the patient. The following medication will need to be called in to her new pharmacy.  Please call the patient back if any questions     Diclofenac Sodium 50 MG TAKE 1 TABLET(50 MG) BY MOUTH TWICE DAILY AS NEEDED  [facility] Located in: [facility] Address: [address], [place], [place] [address]  Phone: [phone]

## 2020-12-16 NOTE — Telephone Encounter (Signed)
Please schedule a f/u appt for her next month with her PCP (Dr. Allyson Sabal)

## 2020-12-17 MED ORDER — DICLOFENAC SODIUM 50 MG PO TBEC
DELAYED_RELEASE_TABLET | ORAL | 0 refills | Status: DC
Start: 2020-12-17 — End: 2020-12-24

## 2020-12-19 DIAGNOSIS — S83241A Other tear of medial meniscus, current injury, right knee, initial encounter: Secondary | ICD-10-CM | POA: Diagnosis not present

## 2020-12-23 ENCOUNTER — Other Ambulatory Visit: Payer: Self-pay

## 2020-12-23 ENCOUNTER — Ambulatory Visit: Payer: Federal, State, Local not specified - PPO | Admitting: Cardiology

## 2020-12-23 ENCOUNTER — Encounter: Payer: Self-pay | Admitting: Cardiology

## 2020-12-23 ENCOUNTER — Ambulatory Visit (INDEPENDENT_AMBULATORY_CARE_PROVIDER_SITE_OTHER): Payer: Federal, State, Local not specified - PPO | Admitting: Cardiology

## 2020-12-23 VITALS — BP 106/64 | HR 64 | Ht 63.0 in | Wt 195.0 lb

## 2020-12-23 DIAGNOSIS — I5189 Other ill-defined heart diseases: Secondary | ICD-10-CM | POA: Diagnosis not present

## 2020-12-23 DIAGNOSIS — I2510 Atherosclerotic heart disease of native coronary artery without angina pectoris: Secondary | ICD-10-CM | POA: Diagnosis not present

## 2020-12-23 DIAGNOSIS — K449 Diaphragmatic hernia without obstruction or gangrene: Secondary | ICD-10-CM

## 2020-12-23 DIAGNOSIS — E669 Obesity, unspecified: Secondary | ICD-10-CM | POA: Diagnosis not present

## 2020-12-23 NOTE — Patient Instructions (Signed)

## 2020-12-23 NOTE — Progress Notes (Signed)
Cardiology Office Note:    Date:  12/23/2020   ID:  Alexa Rodriguez, DOB May 01, 1956, MRN [id number]  PCP:  Virl Axe, MD  Cardiologist:  Berniece Salines, DO  Electrophysiologist:  None   Referring MD: Virl Axe, MD   I am doing fine  History of Present Illness:    Alexa Rodriguez is a 65 y.o. female with a hx of hyperlipidemia  Past Medical History:  Diagnosis Date  . ADHD 01/30/2020  . Bradycardia on ECG 01/30/2020  . Depression 01/30/2020  . Fibromyalgia 01/30/2020  . Healthcare maintenance 08/11/2020  . OSA (obstructive sleep apnea) 01/30/2020  . Stable angina ([place]) 08/09/2020    History reviewed. No pertinent surgical history.  Current Medications: Current Meds  Medication Sig  . aspirin EC 81 MG tablet Take 1 tablet (81 mg total) by mouth daily. Swallow whole.  Marland Kitchen atorvastatin (LIPITOR) 10 MG tablet Take 1 tablet (10 mg total) by mouth daily.  . diclofenac (VOLTAREN) 50 MG EC tablet TAKE 1 TABLET(50 MG) BY MOUTH TWICE DAILY AS NEEDED  . DULoxetine (CYMBALTA) 60 MG capsule Take 1 capsule (60 mg total) by mouth daily.  . fluorometholone (FML) 0.1 % ophthalmic ointment 1 application 4 (four) times daily.  Marland Kitchen lidocaine (LIDODERM) 5 % Place 1 patch onto the skin every 12 (twelve) hours. Remove & Discard patch within 12 hours or as directed by MD  . nitroGLYCERIN (NITROSTAT) 0.4 MG SL tablet Place 1 tablet (0.4 mg total) under the tongue every 5 (five) minutes as needed for chest pain.  Marland Kitchen olmesartan (BENICAR) 20 MG tablet Take 0.5 tablets (10 mg total) by mouth daily.  . [DISCONTINUED] ivabradine (CORLANOR) 5 MG TABS tablet Take 1 tablet (5 mg total) by mouth 2 (two) times daily with a meal. Take 2 hours before your Cardiac CT  . [DISCONTINUED] ramelteon (ROZEREM) 8 MG tablet Take 1 tablet (8 mg total) by mouth at bedtime.     Allergies:   Lopressor [metoprolol tartrate] and Cortisone   Social History   Socioeconomic History  . Marital status: Married    Spouse  name: Not on file  . Number of children: Not on file  . Years of education: Not on file  . Highest education level: Not on file  Occupational History  . Not on file  Tobacco Use  . Smoking status: Former Smoker    Packs/day: 1.50    Years: 20.00    Pack years: 30.00    Types: Cigarettes    Quit date: 08/24/2009    Years since quitting: 11.3  . Smokeless tobacco: Never Used  Substance and Sexual Activity  . Alcohol use: Not on file  . Drug use: Not on file  . Sexual activity: Not on file  Other Topics Concern  . Not on file  Social History Narrative  . Not on file   Social Determinants of Health   Financial Resource Strain: Not on file  Food Insecurity: Not on file  Transportation Needs: Not on file  Physical Activity: Not on file  Stress: Not on file  Social Connections: Not on file     Family History: The patient's family history is not on file.  ROS:   Review of Systems  Constitution: Negative for decreased appetite, fever and weight gain.  HENT: Negative for congestion, ear discharge, hoarse voice and sore throat.   Eyes: Negative for discharge, redness, vision loss in right eye and visual halos.  Cardiovascular: Negative for chest pain, dyspnea on exertion, leg swelling,  orthopnea and palpitations.  Respiratory: Negative for cough, hemoptysis, shortness of breath and snoring.   Endocrine: Negative for heat intolerance and polyphagia.  Hematologic/Lymphatic: Negative for bleeding problem. Does not bruise/bleed easily.  Skin: Negative for flushing, nail changes, rash and suspicious lesions.  Musculoskeletal: Negative for arthritis, joint pain, muscle cramps, myalgias, neck pain and stiffness.  Gastrointestinal: Negative for abdominal pain, bowel incontinence, diarrhea and excessive appetite.  Genitourinary: Negative for decreased libido, genital sores and incomplete emptying.  Neurological: Negative for brief paralysis, focal weakness, headaches and loss of balance.   Psychiatric/Behavioral: Negative for altered mental status, depression and suicidal ideas.  Allergic/Immunologic: Negative for HIV exposure and persistent infections.    EKGs/Labs/Other Studies Reviewed:    The following studies were reviewed today:   EKG:  The ekg ordered today demonstrates   Coronary CTA October 07, 2020 Aorta: Normal size.  No calcifications.  No dissection.  Aortic Valve:  Trileaflet.  No calcifications.  Coronary calcium score 1  Coronary Arteries:  Normal coronary origin.  Left  dominance.  RCA is a small non-dominant artery.  There is no plaque.  Left main is a large artery that gives rise to LAD and LCX arteries.  LAD is a large vessel that has no plaque.  LCX is a dominant artery that gives rise to one large OM1 branch, and PDA. There is minimal (<24%) in the mid LCX. The proximal and distal LCX with no plaques.  Other findings:  Normal pulmonary vein drainage into the left atrium.  Normal left atrial appendage without a thrombus.  Normal size of the pulmonary artery.  IMPRESSION: 1. Coronary calcium score of 1. This was 54 percentile for age and sex matched control.  2. Normal coronary origin with left dominance.  3. Minimal CAD. CADRADS 1. Recommend medical therapy for primary prevention.  Jamontae Thwaites, DO Noncardiac IMPRESSION: 1.  No acute findings in the imaged extracardiac chest. 2.  Aortic Atherosclerosis (ICD10-I70.0). 3. Moderate hiatal hernia.   Transthoracic echocardiogram October 21, 2020 IMPRESSIONS  1. Left ventricular ejection fraction, by estimation, is 60 to 65%. The  left ventricle has normal function. The left ventricle has no regional  wall motion abnormalities. There is mild concentric left ventricular  hypertrophy. Left ventricular diastolic  parameters are consistent with Grade I diastolic dysfunction (impaired  relaxation).  2. Right ventricular systolic function is normal. The right  ventricular  size is normal. There is normal pulmonary artery systolic pressure.  3. The mitral valve is normal in structure. Trivial mitral valve  regurgitation. No evidence of mitral stenosis.  4. The aortic valve is normal in structure. Aortic valve regurgitation is  not visualized. Mild aortic valve sclerosis is present, with no evidence  of aortic valve stenosis.  5. The inferior vena cava is normal in size with greater than 50%  respiratory variability, suggesting right atrial pressure of 3 mmHg.   FINDINGS  Left Ventricle: Left ventricular ejection fraction, by estimation, is 60  to 65%. The left ventricle has normal function. The left ventricle has no  regional wall motion abnormalities. The left ventricular internal cavity  size was normal in size. There is  mild concentric left ventricular hypertrophy. Left ventricular diastolic  parameters are consistent with Grade I diastolic dysfunction (impaired  relaxation).   Right Ventricle: The right ventricular size is normal. No increase in  right ventricular wall thickness. Right ventricular systolic function is  normal. There is normal pulmonary artery systolic pressure. The tricuspid  regurgitant velocity is 1.83  m/s, and  with an assumed right atrial pressure of 3 mmHg, the estimated right  ventricular systolic pressure is 16.4 mmHg.   Left Atrium: Left atrial size was normal in size.   Right Atrium: Right atrial size was normal in size.   Pericardium: There is no evidence of pericardial effusion. Presence of  pericardial fat pad.   Mitral Valve: The mitral valve is normal in structure. Trivial mitral  valve regurgitation. No evidence of mitral valve stenosis.   Tricuspid Valve: The tricuspid valve is normal in structure. Tricuspid  valve regurgitation is trivial. No evidence of tricuspid stenosis.   Aortic Valve: The aortic valve is normal in structure. Aortic valve  regurgitation is not visualized. Mild aortic  valve sclerosis is present,  with no evidence of aortic valve stenosis.   Pulmonic Valve: The pulmonic valve was normal in structure. Pulmonic valve  regurgitation is not visualized. No evidence of pulmonic stenosis.   Aorta: The aortic root is normal in size and structure.   Venous: The inferior vena cava is normal in size with greater than 50%  respiratory variability, suggesting right atrial pressure of 3 mmHg.   IAS/Shunts: No atrial level shunt detected by color flow Doppler.   Recent Labs: 09/23/2020: BUN 19; Creatinine, Ser 0.75; Hemoglobin 12.2; Platelets 240; Potassium 4.4; Sodium 147  Recent Lipid Panel    Component Value Date/Time   CHOL 229 (H) 08/08/2020 1615   TRIG 143 08/08/2020 1615   HDL 73 08/08/2020 1615   CHOLHDL 3.1 08/08/2020 1615   LDLCALC 131 (H) 08/08/2020 1615    Physical Exam:    VS:  BP 106/64   Pulse 64   Ht 5\' 3"  (1.6 m)   Wt 195 lb (88.5 kg)   SpO2 96%   BMI 34.54 kg/m     Wt Readings from Last 3 Encounters:  12/23/20 195 lb (88.5 kg)  10/21/20 201 lb 6.4 oz (91.4 kg)  09/23/20 197 lb 12.8 oz (89.7 kg)     GEN: Well nourished, well developed in no acute distress HEENT: Normal NECK: No JVD; No carotid bruits LYMPHATICS: No lymphadenopathy CARDIAC: S1S2 noted,RRR, no murmurs, rubs, gallops RESPIRATORY:  Clear to auscultation without rales, wheezing or rhonchi  ABDOMEN: Soft, non-tender, non-distended, +bowel sounds, no guarding. EXTREMITIES: No edema, No cyanosis, no clubbing MUSCULOSKELETAL:  No deformity  SKIN: Warm and dry NEUROLOGIC:  Alert and oriented x 3, non-focal PSYCHIATRIC:  Normal affect, good insight  ASSESSMENT:    1. Minimal CAD   2. Hiatal hernia   3. Grade I diastolic dysfunction   4. Obesity (BMI 30-39.9)    PLAN:     We talked about her echocardiogram results as well as her coronary CTA results which showed minimal coronary artery disease with moderate hiatal hernia noted in her noncardiac portion.  From a  cardiovascular standpoint there will be no significant changes.  She seems to be improved from her shortness of breath.  But she does have significant knee pain that she is going to get an MRI for tomorrow.  The patient understands the need to lose weight with diet and exercise. We have discussed specific strategies for this.  She is looking forward to taking a trip in her camper to [place] when she is cleared from her MRI tomorrow.  The patient is in agreement with the above plan. The patient left the office in stable condition.  The patient will follow up in 1 year or sooner if needed.   Medication Adjustments/Labs  and Tests Ordered: Current medicines are reviewed at length with the patient today.  Concerns regarding medicines are outlined above.  No orders of the defined types were placed in this encounter.  No orders of the defined types were placed in this encounter.   Patient Instructions  Medication Instructions:  Your physician recommends that you continue on your current medications as directed. Please refer to the Current Medication list given to you today.  *If you need a refill on your cardiac medications before your next appointment, please call your pharmacy*   Lab Work: None If you have labs (blood work) drawn today and your tests are completely normal, you will receive your results only by: Marland Kitchen MyChart Message (if you have MyChart) OR . A paper copy in the mail If you have any lab test that is abnormal or we need to change your treatment, we will call you to review the results.   Testing/Procedures: None   Follow-Up: At [facility], you and your health needs are our priority.  As part of our continuing mission to provide you with exceptional heart care, we have created designated Provider Care Teams.  These Care Teams include your primary Cardiologist (physician) and Advanced Practice Providers (APPs -  Physician Assistants and Nurse Practitioners) who all work  together to provide you with the care you need, when you need it.  We recommend signing up for the patient portal called "MyChart".  Sign up information is provided on this After Visit Summary.  MyChart is used to connect with patients for Virtual Visits (Telemedicine).  Patients are able to view lab/test results, encounter notes, upcoming appointments, etc.  Non-urgent messages can be sent to your provider as well.   To learn more about what you can do with MyChart, go to [email or web address].    Your next appointment:   1 year(s)  The format for your next appointment:   In Person  Provider:   Berniece Salines, DO   Other Instructions      Adopting a Healthy Lifestyle.  Know what a healthy weight is for you (roughly BMI <25) and aim to maintain this   Aim for 7+ servings of fruits and vegetables daily   65-80+ fluid ounces of water or unsweet tea for healthy kidneys   Limit to max 1 drink of alcohol per day; avoid smoking/tobacco   Limit animal fats in diet for cholesterol and heart health - choose grass fed whenever available   Avoid highly processed foods, and foods high in saturated/trans fats   Aim for low stress - take time to unwind and care for your mental health   Aim for 150 min of moderate intensity exercise weekly for heart health, and weights twice weekly for bone health   Aim for 7-9 hours of sleep daily   When it comes to diets, agreement about the perfect plan isnt easy to find, even among the experts. Experts at the [employer] developed an idea known as the Healthy Eating Plate. Just imagine a plate divided into logical, healthy portions.   The emphasis is on diet quality:   Load up on vegetables and fruits - one-half of your plate: Aim for color and variety, and remember that potatoes dont count.   Go for whole grains - one-quarter of your plate: Whole wheat, barley, wheat berries, quinoa, oats, brown rice, and foods made with  them. If you want pasta, go with whole wheat pasta.   Protein power - one-quarter of  your plate: Fish, chicken, beans, and nuts are all healthy, versatile protein sources. Limit red meat.   The diet, however, does go beyond the plate, offering a few other suggestions.   Use healthy plant oils, such as olive, canola, soy, corn, sunflower and peanut. Check the labels, and avoid partially hydrogenated oil, which have unhealthy trans fats.   If youre thirsty, drink water. Coffee and tea are good in moderation, but skip sugary drinks and limit milk and dairy products to one or two daily servings.   The type of carbohydrate in the diet is more important than the amount. Some sources of carbohydrates, such as vegetables, fruits, whole grains, and beans-are healthier than others.   Finally, stay active  Signed, Berniece Salines, DO  12/23/2020 4:35 PM    [facility]

## 2020-12-24 ENCOUNTER — Ambulatory Visit: Payer: Federal, State, Local not specified - PPO | Admitting: Student

## 2020-12-24 DIAGNOSIS — I10 Essential (primary) hypertension: Secondary | ICD-10-CM | POA: Diagnosis not present

## 2020-12-24 DIAGNOSIS — M797 Fibromyalgia: Secondary | ICD-10-CM

## 2020-12-24 DIAGNOSIS — M25561 Pain in right knee: Secondary | ICD-10-CM | POA: Diagnosis not present

## 2020-12-24 MED ORDER — HYDROCHLOROTHIAZIDE 12.5 MG PO CAPS: 12.5000 mg | ORAL_CAPSULE | Freq: Every day | ORAL | 3 refills | Status: DC

## 2020-12-24 MED ORDER — OLMESARTAN MEDOXOMIL 20 MG PO TABS: 10.0000 mg | ORAL_TABLET | Freq: Every day | ORAL | 1 refills | Status: DC

## 2020-12-24 MED ORDER — DICLOFENAC SODIUM 50 MG PO TBEC
DELAYED_RELEASE_TABLET | ORAL | 0 refills | Status: DC
Start: 2020-12-24 — End: 2021-03-14

## 2020-12-24 MED ORDER — ATORVASTATIN CALCIUM 10 MG PO TABS: 10.0000 mg | ORAL_TABLET | Freq: Every day | ORAL | 3 refills | Status: DC

## 2020-12-24 NOTE — Assessment & Plan Note (Signed)
   During last encounter, Ms Lebon had elevated blood pressures (145/78) despite therapy with HCTZ 12.5mg  daily. Olmesartan 10mg  daily was added to her BP regimen to help keep it controlled.   She reports that her BP has been doing very well since the addition of olmesartan. Reports that it has been ranging from 110s-120s/60s when she checks it at home. Went to see cardiologist yesterday and her recorded clinic BP was 106/64. She has not experienced any orthostatic symptoms since addition of olmesartan. Furthermore, she notes having reduced salt intake with food and has had a 5-6lb weight loss since the last visit.  Discussed plan to continue with current medications regimen of HCTZ 12.5mg  daily and olmesartan 10mg  daily for BP control, along with continuing to implement low-salt diet. Can reassess at next visit if she will continue to need two medications for BP control or if one medication along with diet control is sufficient.

## 2020-12-24 NOTE — Progress Notes (Signed)
  [facility] Internal Medicine Residency Telephone Encounter Continuity Care Appointment  HPI:   This telephone encounter was created for [name] on 12/24/2020 for the following purpose/cc: f/u for HTN.  During last encounter, Ms Alexa Rodriguez had elevated blood pressures (145/78) despite therapy with HCTZ 12.5mg  daily. Olmesartan 10mg  daily was added to her BP regimen to help keep it controlled.   She reports that her BP has been doing very well since the addition of olmesartan. Reports that it has been ranging from 110s-120s/60s when she checks it at home. Went to see cardiologist yesterday and her recorded clinic BP was 106/64. She has not experienced any orthostatic symptoms since addition of olmesartan. Furthermore, she notes having reduced salt intake with food and has had a 5-6lb weight loss since the last visit.  Discussed plan to continue with current medications regimen of HCTZ 12.5mg  daily and olmesartan 10mg  daily for BP control, along with continuing to implement low-salt diet. Can reassess at next visit if she will continue to need two medications for BP control or if one medication along with diet control is sufficient.   Past Medical History:  Past Medical History:  Diagnosis Date  . ADHD 01/30/2020  . Bradycardia on ECG 01/30/2020  . Depression 01/30/2020  . Fibromyalgia 01/30/2020  . Healthcare maintenance 08/11/2020  . OSA (obstructive sleep apnea) 01/30/2020  . Stable angina ([place]) 08/09/2020      ROS:   Negative ROS   Assessment / Plan / Recommendations:   Please see A&P under problem oriented charting for assessment of the patient's acute and chronic medical conditions.   As always, pt is advised that if symptoms worsen or new symptoms arise, they should go to an urgent care facility or to to ER for further evaluation.   Consent and Medical Decision Making:   Patient discussed with Dr. Jimmye Norman  This is a telephone encounter between Alexa  Rodriguez and Alexa Rodriguez on 12/24/2020 for f/u of HTN. The visit was conducted with the patient located at home and Alexa Rodriguez at [facility]. The patient's identity was confirmed using their DOB and current address. The patient has consented to being evaluated through a telephone encounter and understands the associated risks (an examination cannot be done and the patient may need to come in for an appointment) / benefits (allows the patient to remain at home, decreasing exposure to coronavirus). I personally spent 14 minutes on medical discussion.

## 2020-12-24 NOTE — Progress Notes (Signed)
Internal Medicine Clinic Attending  Case discussed with Dr. Jinwala  At the time of the visit.  We reviewed the resident's history and exam and pertinent patient test results.  I agree with the assessment, diagnosis, and plan of care documented in the resident's note.  

## 2020-12-24 NOTE — Progress Notes (Signed)
Internal Medicine Clinic Attending  Case discussed with Dr. Jinwala  At the time of the visit.  We reviewed the resident's history and pertinent patient test results.  I agree with the assessment, diagnosis, and plan of care documented in the resident's note.  

## 2021-01-09 DIAGNOSIS — M1711 Unilateral primary osteoarthritis, right knee: Secondary | ICD-10-CM | POA: Diagnosis not present

## 2021-01-14 ENCOUNTER — Telehealth: Payer: Self-pay | Admitting: *Deleted

## 2021-01-14 NOTE — Telephone Encounter (Signed)
   [facility] Pre-operative Risk Assessment    Request for surgical clearance:  1. What type of surgery is being performed? Right Total Knee Replacement   2. When is this surgery scheduled? TBD   3. What type of clearance is required (medical clearance vs. Pharmacy clearance to hold med vs. Both)? Both  4. Are there any medications that need to be held prior to surgery and how long?Aspirin  5. Practice name and name of physician performing surgery? [facility], Dr. Elsie Saas   6. What is your office phone number [phone]    7.   What is your office fax number [phone]  8.   Anesthesia type (None, local, MAC, general) ? Unknown   Alexa Rodriguez 01/14/2021, 12:09 PM  _________________________________________________________________   (provider comments below)

## 2021-01-14 NOTE — Telephone Encounter (Signed)
   Name: Alexa Rodriguez DOB: June 02, 1956  MRN: [id number]  Primary Cardiologist: Berniece Salines, DO  Chart reviewed as part of pre-operative protocol coverage.   65 y.o. female with . Mild non-obs coronary artery disease  . Hypertension  . Hyperlipidemia  . OSA . Echocardiogram 2/22: normal EF, no valve dz . CT 2/22: min non-obs CAD, Ca2+ score 1  Last OV:  12/23/09 with Dr. Harriet Masson Procedure:  R TKR  Rx:  Hold ASA  RCRI:  Perioperative Risk of Major Cardiac Event is (%): 0.4 (low risk)  Left message for pt to call back.   Richardson Dopp, PA-C 01/14/2021, 12:37 PM

## 2021-01-21 ENCOUNTER — Telehealth: Payer: Self-pay

## 2021-01-21 DIAGNOSIS — Z006 Encounter for examination for normal comparison and control in clinical research program: Secondary | ICD-10-CM

## 2021-01-21 NOTE — Telephone Encounter (Signed)
Called patient for 90 day Identify phone call no answer, I left a voicemail stating the intent of the phone call and our call back number to be reached in our department. 

## 2021-01-22 NOTE — Telephone Encounter (Signed)
    Alexa Rodriguez DOB:  01-26-1956  MRN:  [id number]   Primary Cardiologist: Berniece Salines, DO  Chart reviewed as part of pre-operative protocol coverage. Given past medical history and time since last visit, based on ACC/AHA guidelines, Alexa Rodriguez would be at acceptable risk for the planned procedure without further cardiovascular testing. She was last seen 12/23/20 doing overall well from cardiac perspective.   Given history of coronary artery disease without previous stenting would prefer to continue aspirin throughout the perioperative period. However, if deemed necessary by surgeon would be permissible to hold.   I will route this recommendation to the requesting party via Epic fax function and remove from pre-op pool.  Please call with questions.  Loel Dubonnet, NP 01/22/2021, 9:36 AM

## 2021-01-28 ENCOUNTER — Telehealth: Payer: Self-pay | Admitting: *Deleted

## 2021-01-28 DIAGNOSIS — Z006 Encounter for examination for normal comparison and control in clinical research program: Secondary | ICD-10-CM

## 2021-01-28 NOTE — Telephone Encounter (Signed)
I called patient for 90-day Identify Study phone call. Patient is still having the shortness of breath but not related to heart.Patient states doctor thinks could be related to lungs. I will call patient in February for 1 year follow-up.

## 2021-01-29 ENCOUNTER — Other Ambulatory Visit: Payer: Self-pay

## 2021-01-29 ENCOUNTER — Ambulatory Visit (INDEPENDENT_AMBULATORY_CARE_PROVIDER_SITE_OTHER): Payer: Federal, State, Local not specified - PPO | Admitting: Student

## 2021-01-29 ENCOUNTER — Encounter: Payer: Self-pay | Admitting: Student

## 2021-01-29 VITALS — BP 132/71 | HR 69 | Temp 97.8°F | Ht 63.0 in | Wt 199.2 lb

## 2021-01-29 DIAGNOSIS — M25561 Pain in right knee: Secondary | ICD-10-CM | POA: Diagnosis not present

## 2021-01-29 DIAGNOSIS — I10 Essential (primary) hypertension: Secondary | ICD-10-CM

## 2021-01-29 DIAGNOSIS — Z23 Encounter for immunization: Secondary | ICD-10-CM | POA: Diagnosis not present

## 2021-01-29 DIAGNOSIS — M797 Fibromyalgia: Secondary | ICD-10-CM | POA: Diagnosis not present

## 2021-01-29 DIAGNOSIS — G8929 Other chronic pain: Secondary | ICD-10-CM | POA: Diagnosis not present

## 2021-01-29 MED ORDER — METHOCARBAMOL 750 MG PO TABS: 750.0000 mg | ORAL_TABLET | Freq: Every day | ORAL | 0 refills | Status: DC | PRN

## 2021-01-29 NOTE — Assessment & Plan Note (Signed)
Patient reports that she did not take her blood pressure medication prior to today's visit. Blood pressure today of 132/71. Review of patient's records reveals that patient may be able to effectively control this medical condition with one antihypertensive alone. -Discontinue hydrochlorothiazide -Continue olmesartan 10mg  daily with consideration to increase dose as indicated -Basic metabolic panel today
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
## 2021-01-29 NOTE — Assessment & Plan Note (Signed)
Patient continues to endorse diffuse muscle pain which she has been taking cymbalta, applying voltaren gel and applying lidocaine patches without reliefs. -Prescribe trial of Robaxin to take daily as needed for muscle pain

## 2021-01-29 NOTE — Progress Notes (Signed)
Internal Medicine Clinic Attending  Case discussed with Dr. Johnson  At the time of the visit.  We reviewed the resident's history and exam and pertinent patient test results.  I agree with the assessment, diagnosis, and plan of care documented in the resident's note.  

## 2021-01-29 NOTE — Patient Instructions (Signed)
Ms. Shands,  It was a pleasure meeting you today in clinic.  For your right knee pain, we believe that you are medically clear to proceed with getting the operation. Please follow-up closely with your cardiologist as well.  For your hypertension, we feel that you can stop the hydrochlorothiazide at this time and continue taking olmesartan every day. We will collect a basic metabolic panel and I will call you with the results.  For your fibromyalgia, I have prescribed a short course of Robaxin which you can take at most once daily as needed.  Sincerely, Dr. Paulla Dolly, MD

## 2021-01-29 NOTE — Progress Notes (Signed)
   CC: Surgical clearance for right total knee replacement  HPI:  [name] is a 65 y.o. with past medical history of nonobstructive coronary artery disease, hypertension, hyperlipidemia, obstructive sleep apnea, fibromyalgia and obesity who presents to clinic for surgical clearance for right total knee replacement. Refer to problem list for charting of this encounter.  Past Medical History:  Diagnosis Date  . ADHD 01/30/2020  . Bradycardia on ECG 01/30/2020  . Depression 01/30/2020  . Fibromyalgia 01/30/2020  . Healthcare maintenance 08/11/2020  . OSA (obstructive sleep apnea) 01/30/2020  . Stable angina ([place]) 08/09/2020   Review of Systems:  Endorses right knee pain, right knee swelling. Denies fevers, chills, chest pain, shortness of breath.  Physical Exam:  Vitals:   01/29/21 1315  BP: 132/71  Pulse: 69  Temp: 97.8 F (36.6 C)  SpO2: 100%  Weight: 199 lb 3.2 oz (90.4 kg)  Height: 5\' 3"  (1.6 m)   Physical Exam Constitutional:      General: She is not in acute distress.    Appearance: She is not ill-appearing.  Musculoskeletal:     Comments: No gross deformity or swelling of the right knee. Palpable crepitus with flexion and extension of the right knee. Tenderness to palpation along the right knee medial joint line  Skin:    Capillary Refill: Capillary refill takes less than 2 seconds.     Comments: No erythema overlying right knee  Neurological:     General: No focal deficit present.     Mental Status: She is alert. Mental status is at baseline.  Psychiatric:        Mood and Affect: Mood normal.        Behavior: Behavior normal.    Assessment & Plan:   See Encounters Tab for problem based charting.  Patient discussed with Dr. Jimmye Norman

## 2021-01-30 LAB — BMP8+ANION GAP
Anion Gap: 20 mmol/L — ABNORMAL HIGH (ref 10.0–18.0)
BUN/Creatinine Ratio: 30 — ABNORMAL HIGH (ref 12–28)
BUN: 22 mg/dL (ref 8–27)
CO2: 20 mmol/L (ref 20–29)
Calcium: 9.4 mg/dL (ref 8.7–10.3)
Chloride: 101 mmol/L (ref 96–106)
Creatinine, Ser: 0.74 mg/dL (ref 0.57–1.00)
Glucose: 98 mg/dL (ref 65–99)
Potassium: 4.3 mmol/L (ref 3.5–5.2)
Sodium: 141 mmol/L (ref 134–144)
eGFR: 90 mL/min/{1.73_m2} (ref >59)

## 2021-02-07 ENCOUNTER — Other Ambulatory Visit: Payer: Self-pay

## 2021-02-07 MED ORDER — OLMESARTAN MEDOXOMIL 20 MG PO TABS: 10.0000 mg | ORAL_TABLET | Freq: Every day | ORAL | 3 refills | Status: DC

## 2021-02-10 NOTE — Telephone Encounter (Signed)
Patient returning a call regarding Cardiac Clearance

## 2021-02-10 NOTE — Telephone Encounter (Signed)
   Looks like pre-op risk assessment was addressed on 01/22/2021. However, patient called back today stating that due to some insurance issues, she may not have surgery until October. She had thorough cardiac work-up earlier this year. Coronary CTA in 09/2020 showed coronary calcium score of 1 with only  minimal CAD. Echo in 09/2020 showed LVEF of 60-65% with normal wall motion, grade 1 diastolic dysfunction, and no significant valvular disease. Patient reports doing well today with no chest pain, shortness of breath, acute CHF symptoms, palpitations, or syncope. 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 Given recent thorough work-up, patient at acceptable risk for procedure as noted below. The patient was advised that if she develops new symptoms prior to surgery to contact our office to arrange for a follow-up visit, and she verbalized understanding. Otherwise, OK for surgery even if it is not until October 2022.   Patient is on Aspirin for primary prevention. She has never had an MI, cardiac stenting, or stroke. Therefore, OK to hold Aspirin if needed prior to surgery.   I will route this recommendation to the requesting party via Epic fax function and remove from pre-op pool.  Please call with questions.  Darreld Mclean, PA-C 02/10/2021, 2:27 PM

## 2021-02-11 ENCOUNTER — Telehealth: Payer: Self-pay

## 2021-02-11 NOTE — Telephone Encounter (Signed)
Faxed clearance status to [employer] at [employer].

## 2021-02-25 ENCOUNTER — Encounter: Payer: Self-pay | Admitting: *Deleted

## 2021-02-28 ENCOUNTER — Other Ambulatory Visit: Payer: Self-pay | Admitting: Student

## 2021-02-28 DIAGNOSIS — M797 Fibromyalgia: Secondary | ICD-10-CM

## 2021-03-04 ENCOUNTER — Ambulatory Visit: Payer: Federal, State, Local not specified - PPO

## 2021-03-07 ENCOUNTER — Other Ambulatory Visit: Payer: Self-pay

## 2021-03-07 ENCOUNTER — Ambulatory Visit
Admission: RE | Admit: 2021-03-07 | Discharge: 2021-03-07 | Disposition: A | Discharge status: Home / Self Care | Payer: Federal, State, Local not specified - PPO | Source: Ambulatory Visit | Attending: Student in an Organized Health Care Education/Training Program | Admitting: Student in an Organized Health Care Education/Training Program

## 2021-03-07 DIAGNOSIS — Z1231 Encounter for screening mammogram for malignant neoplasm of breast: Secondary | ICD-10-CM

## 2021-03-10 ENCOUNTER — Other Ambulatory Visit: Payer: Self-pay | Admitting: Internal Medicine

## 2021-03-10 DIAGNOSIS — M797 Fibromyalgia: Secondary | ICD-10-CM

## 2021-03-11 ENCOUNTER — Other Ambulatory Visit: Payer: Self-pay | Admitting: Student in an Organized Health Care Education/Training Program

## 2021-03-11 DIAGNOSIS — R928 Other abnormal and inconclusive findings on diagnostic imaging of breast: Secondary | ICD-10-CM

## 2021-03-14 NOTE — Telephone Encounter (Signed)
Renal fxn nml 01/2021

## 2021-03-28 ENCOUNTER — Ambulatory Visit: Payer: Federal, State, Local not specified - PPO

## 2021-03-28 ENCOUNTER — Ambulatory Visit
Admission: RE | Admit: 2021-03-28 | Discharge: 2021-03-28 | Disposition: A | Discharge status: Home / Self Care | Payer: Federal, State, Local not specified - PPO | Source: Ambulatory Visit | Attending: Student in an Organized Health Care Education/Training Program | Admitting: Student in an Organized Health Care Education/Training Program

## 2021-03-28 ENCOUNTER — Other Ambulatory Visit: Payer: Self-pay

## 2021-03-28 DIAGNOSIS — R922 Inconclusive mammogram: Secondary | ICD-10-CM | POA: Diagnosis not present

## 2021-03-28 DIAGNOSIS — R928 Other abnormal and inconclusive findings on diagnostic imaging of breast: Secondary | ICD-10-CM

## 2021-04-09 ENCOUNTER — Other Ambulatory Visit: Payer: Self-pay | Admitting: Internal Medicine

## 2021-04-09 DIAGNOSIS — M797 Fibromyalgia: Secondary | ICD-10-CM

## 2021-04-23 ENCOUNTER — Telehealth: Payer: Self-pay

## 2021-04-23 NOTE — Telephone Encounter (Signed)
Pt is requesting a call back she stated she was in the office on 01/29/21 , for her surgical clearance she saw  Dr Zigmund Daniel  but her paper work was never sent to the Dr Gabriel Carina that is to do her knee replacement so she has not been able to have her surgery

## 2021-04-23 NOTE — Telephone Encounter (Signed)
[place] office had faxed completed form to [employer] office and will fax again. Pt was called and informed. Stated she will call their office later today and if not received she will call our office back.

## 2021-05-09 ENCOUNTER — Other Ambulatory Visit: Payer: Self-pay | Admitting: Internal Medicine

## 2021-05-09 DIAGNOSIS — M797 Fibromyalgia: Secondary | ICD-10-CM

## 2021-05-12 ENCOUNTER — Other Ambulatory Visit: Payer: Self-pay

## 2021-05-12 DIAGNOSIS — M797 Fibromyalgia: Secondary | ICD-10-CM

## 2021-05-13 MED ORDER — DICLOFENAC SODIUM 50 MG PO TBEC: DELAYED_RELEASE_TABLET | ORAL | 0 refills | Status: DC

## 2021-05-19 ENCOUNTER — Other Ambulatory Visit: Payer: Self-pay

## 2021-05-19 DIAGNOSIS — M797 Fibromyalgia: Secondary | ICD-10-CM

## 2021-05-19 MED ORDER — DICLOFENAC SODIUM 50 MG PO TBEC: DELAYED_RELEASE_TABLET | ORAL | 0 refills | Status: DC

## 2021-05-19 MED ORDER — LIDOCAINE 5 % EX PTCH: 1.0000 | MEDICATED_PATCH | Freq: Two times a day (BID) | CUTANEOUS | 1 refills | Status: AC

## 2021-05-28 ENCOUNTER — Other Ambulatory Visit: Payer: Self-pay

## 2021-05-28 DIAGNOSIS — M797 Fibromyalgia: Secondary | ICD-10-CM

## 2021-05-28 MED ORDER — ATORVASTATIN CALCIUM 10 MG PO TABS: 10.0000 mg | ORAL_TABLET | Freq: Every day | ORAL | 3 refills | Status: DC

## 2021-05-28 MED ORDER — DULOXETINE HCL 60 MG PO CPEP: 60.0000 mg | ORAL_CAPSULE | Freq: Every day | ORAL | 0 refills | Status: DC

## 2021-05-28 MED ORDER — METHOCARBAMOL 750 MG PO TABS: ORAL_TABLET | ORAL | 0 refills | Status: DC

## 2021-05-28 MED ORDER — OLMESARTAN MEDOXOMIL 20 MG PO TABS: 10.0000 mg | ORAL_TABLET | Freq: Every day | ORAL | 3 refills | Status: DC

## 2021-06-17 DIAGNOSIS — M25551 Pain in right hip: Secondary | ICD-10-CM | POA: Diagnosis not present

## 2021-06-17 DIAGNOSIS — M545 Low back pain, unspecified: Secondary | ICD-10-CM | POA: Diagnosis not present

## 2021-06-19 DIAGNOSIS — M545 Low back pain, unspecified: Secondary | ICD-10-CM | POA: Diagnosis not present

## 2021-06-30 DIAGNOSIS — R791 Abnormal coagulation profile: Secondary | ICD-10-CM | POA: Diagnosis not present

## 2021-06-30 DIAGNOSIS — Z01812 Encounter for preprocedural laboratory examination: Secondary | ICD-10-CM | POA: Diagnosis not present

## 2021-06-30 DIAGNOSIS — M1711 Unilateral primary osteoarthritis, right knee: Secondary | ICD-10-CM | POA: Diagnosis not present

## 2021-06-30 DIAGNOSIS — M25561 Pain in right knee: Secondary | ICD-10-CM | POA: Diagnosis not present

## 2021-07-02 DIAGNOSIS — G8918 Other acute postprocedural pain: Secondary | ICD-10-CM | POA: Diagnosis not present

## 2021-07-02 DIAGNOSIS — M1711 Unilateral primary osteoarthritis, right knee: Secondary | ICD-10-CM | POA: Diagnosis not present

## 2021-07-04 DIAGNOSIS — Z96651 Presence of right artificial knee joint: Secondary | ICD-10-CM | POA: Diagnosis not present

## 2021-07-04 DIAGNOSIS — M1711 Unilateral primary osteoarthritis, right knee: Secondary | ICD-10-CM | POA: Diagnosis not present

## 2021-07-08 ENCOUNTER — Other Ambulatory Visit: Payer: Self-pay | Admitting: Student

## 2021-07-08 DIAGNOSIS — M797 Fibromyalgia: Secondary | ICD-10-CM

## 2021-07-10 DIAGNOSIS — M1711 Unilateral primary osteoarthritis, right knee: Secondary | ICD-10-CM | POA: Diagnosis not present

## 2021-07-15 DIAGNOSIS — M1711 Unilateral primary osteoarthritis, right knee: Secondary | ICD-10-CM | POA: Diagnosis not present

## 2021-07-21 DIAGNOSIS — M25561 Pain in right knee: Secondary | ICD-10-CM | POA: Diagnosis not present

## 2021-07-21 DIAGNOSIS — R260 Ataxic gait: Secondary | ICD-10-CM | POA: Diagnosis not present

## 2021-07-21 DIAGNOSIS — M6281 Muscle weakness (generalized): Secondary | ICD-10-CM | POA: Diagnosis not present

## 2021-07-23 DIAGNOSIS — R260 Ataxic gait: Secondary | ICD-10-CM | POA: Diagnosis not present

## 2021-07-23 DIAGNOSIS — M6281 Muscle weakness (generalized): Secondary | ICD-10-CM | POA: Diagnosis not present

## 2021-07-23 DIAGNOSIS — M25561 Pain in right knee: Secondary | ICD-10-CM | POA: Diagnosis not present

## 2021-07-25 DIAGNOSIS — M25561 Pain in right knee: Secondary | ICD-10-CM | POA: Diagnosis not present

## 2021-07-25 DIAGNOSIS — R260 Ataxic gait: Secondary | ICD-10-CM | POA: Diagnosis not present

## 2021-07-25 DIAGNOSIS — M6281 Muscle weakness (generalized): Secondary | ICD-10-CM | POA: Diagnosis not present

## 2021-07-30 DIAGNOSIS — M6281 Muscle weakness (generalized): Secondary | ICD-10-CM | POA: Diagnosis not present

## 2021-07-30 DIAGNOSIS — R260 Ataxic gait: Secondary | ICD-10-CM | POA: Diagnosis not present

## 2021-07-30 DIAGNOSIS — M25561 Pain in right knee: Secondary | ICD-10-CM | POA: Diagnosis not present

## 2021-08-01 DIAGNOSIS — M25561 Pain in right knee: Secondary | ICD-10-CM | POA: Diagnosis not present

## 2021-08-01 DIAGNOSIS — R260 Ataxic gait: Secondary | ICD-10-CM | POA: Diagnosis not present

## 2021-08-01 DIAGNOSIS — M6281 Muscle weakness (generalized): Secondary | ICD-10-CM | POA: Diagnosis not present

## 2021-08-07 ENCOUNTER — Other Ambulatory Visit: Payer: Self-pay | Admitting: Student

## 2021-08-07 DIAGNOSIS — M797 Fibromyalgia: Secondary | ICD-10-CM

## 2021-08-07 DIAGNOSIS — M545 Low back pain, unspecified: Secondary | ICD-10-CM | POA: Diagnosis not present

## 2021-08-07 DIAGNOSIS — M1711 Unilateral primary osteoarthritis, right knee: Secondary | ICD-10-CM | POA: Diagnosis not present

## 2021-08-13 DIAGNOSIS — M25561 Pain in right knee: Secondary | ICD-10-CM | POA: Diagnosis not present

## 2021-08-13 DIAGNOSIS — R260 Ataxic gait: Secondary | ICD-10-CM | POA: Diagnosis not present

## 2021-08-13 DIAGNOSIS — M6281 Muscle weakness (generalized): Secondary | ICD-10-CM | POA: Diagnosis not present

## 2021-08-15 DIAGNOSIS — R260 Ataxic gait: Secondary | ICD-10-CM | POA: Diagnosis not present

## 2021-08-15 DIAGNOSIS — M25561 Pain in right knee: Secondary | ICD-10-CM | POA: Diagnosis not present

## 2021-08-15 DIAGNOSIS — M6281 Muscle weakness (generalized): Secondary | ICD-10-CM | POA: Diagnosis not present

## 2021-08-20 DIAGNOSIS — M25561 Pain in right knee: Secondary | ICD-10-CM | POA: Diagnosis not present

## 2021-08-20 DIAGNOSIS — M6281 Muscle weakness (generalized): Secondary | ICD-10-CM | POA: Diagnosis not present

## 2021-08-20 DIAGNOSIS — R260 Ataxic gait: Secondary | ICD-10-CM | POA: Diagnosis not present

## 2021-08-22 DIAGNOSIS — R260 Ataxic gait: Secondary | ICD-10-CM | POA: Diagnosis not present

## 2021-08-22 DIAGNOSIS — M6281 Muscle weakness (generalized): Secondary | ICD-10-CM | POA: Diagnosis not present

## 2021-08-22 DIAGNOSIS — M25561 Pain in right knee: Secondary | ICD-10-CM | POA: Diagnosis not present

## 2021-08-26 DIAGNOSIS — M5416 Radiculopathy, lumbar region: Secondary | ICD-10-CM | POA: Diagnosis not present

## 2021-08-28 DIAGNOSIS — R260 Ataxic gait: Secondary | ICD-10-CM | POA: Diagnosis not present

## 2021-08-28 DIAGNOSIS — M25561 Pain in right knee: Secondary | ICD-10-CM | POA: Diagnosis not present

## 2021-08-28 DIAGNOSIS — M6281 Muscle weakness (generalized): Secondary | ICD-10-CM | POA: Diagnosis not present

## 2021-08-29 DIAGNOSIS — M25561 Pain in right knee: Secondary | ICD-10-CM | POA: Diagnosis not present

## 2021-08-29 DIAGNOSIS — M6281 Muscle weakness (generalized): Secondary | ICD-10-CM | POA: Diagnosis not present

## 2021-08-29 DIAGNOSIS — R260 Ataxic gait: Secondary | ICD-10-CM | POA: Diagnosis not present

## 2021-09-03 DIAGNOSIS — M6281 Muscle weakness (generalized): Secondary | ICD-10-CM | POA: Diagnosis not present

## 2021-09-03 DIAGNOSIS — R260 Ataxic gait: Secondary | ICD-10-CM | POA: Diagnosis not present

## 2021-09-03 DIAGNOSIS — M25561 Pain in right knee: Secondary | ICD-10-CM | POA: Diagnosis not present

## 2021-09-06 ENCOUNTER — Other Ambulatory Visit: Payer: Self-pay | Admitting: Student

## 2021-09-06 DIAGNOSIS — M797 Fibromyalgia: Secondary | ICD-10-CM

## 2021-09-09 DIAGNOSIS — M5416 Radiculopathy, lumbar region: Secondary | ICD-10-CM | POA: Diagnosis not present

## 2021-09-10 DIAGNOSIS — M6281 Muscle weakness (generalized): Secondary | ICD-10-CM | POA: Diagnosis not present

## 2021-09-10 DIAGNOSIS — M25561 Pain in right knee: Secondary | ICD-10-CM | POA: Diagnosis not present

## 2021-09-10 DIAGNOSIS — R260 Ataxic gait: Secondary | ICD-10-CM | POA: Diagnosis not present

## 2021-09-12 ENCOUNTER — Telehealth: Payer: Self-pay | Admitting: Student

## 2021-09-12 DIAGNOSIS — M5416 Radiculopathy, lumbar region: Secondary | ICD-10-CM | POA: Diagnosis not present

## 2021-09-12 NOTE — Telephone Encounter (Signed)
Pt requesting a call back.  Patient would like clarification about a medication and would like to speak with the nurse.   olmesartan (BENICAR) 20 MG tablet (Expired)  AND A LASIX MEDICATION

## 2021-09-12 NOTE — Telephone Encounter (Signed)
Patient transferred to triage. States she is having post surgical leg swelling and wants to know if she can safely take left over lasix for this with her benicar. Advised patient to contact surgeon's office regarding leg swelling. States she has and they've told her to put on compression stocking. Instructed her to call their office back specifically regarding lasix request. States she will call them now.

## 2021-09-13 NOTE — Telephone Encounter (Signed)
Agree 

## 2021-09-25 ENCOUNTER — Encounter: Payer: Federal, State, Local not specified - PPO | Admitting: Student

## 2021-09-29 ENCOUNTER — Encounter: Payer: Federal, State, Local not specified - PPO | Admitting: Student

## 2021-10-01 ENCOUNTER — Encounter: Payer: Self-pay | Admitting: Student

## 2021-10-01 ENCOUNTER — Ambulatory Visit (INDEPENDENT_AMBULATORY_CARE_PROVIDER_SITE_OTHER): Payer: Federal, State, Local not specified - PPO | Admitting: Student

## 2021-10-01 VITALS — BP 126/69 | HR 75 | Temp 97.8°F | Wt 201.1 lb

## 2021-10-01 DIAGNOSIS — Z13820 Encounter for screening for osteoporosis: Secondary | ICD-10-CM

## 2021-10-01 DIAGNOSIS — M797 Fibromyalgia: Secondary | ICD-10-CM

## 2021-10-01 DIAGNOSIS — Z780 Asymptomatic menopausal state: Secondary | ICD-10-CM

## 2021-10-01 DIAGNOSIS — I10 Essential (primary) hypertension: Secondary | ICD-10-CM

## 2021-10-01 DIAGNOSIS — M25561 Pain in right knee: Secondary | ICD-10-CM

## 2021-10-01 DIAGNOSIS — Z1211 Encounter for screening for malignant neoplasm of colon: Secondary | ICD-10-CM

## 2021-10-01 DIAGNOSIS — R7303 Prediabetes: Secondary | ICD-10-CM | POA: Diagnosis not present

## 2021-10-01 DIAGNOSIS — G8929 Other chronic pain: Secondary | ICD-10-CM

## 2021-10-01 DIAGNOSIS — Z23 Encounter for immunization: Secondary | ICD-10-CM

## 2021-10-01 DIAGNOSIS — E782 Mixed hyperlipidemia: Secondary | ICD-10-CM | POA: Diagnosis not present

## 2021-10-01 DIAGNOSIS — Z0000 Encounter for general adult medical examination without abnormal findings: Secondary | ICD-10-CM

## 2021-10-01 DIAGNOSIS — M5416 Radiculopathy, lumbar region: Secondary | ICD-10-CM | POA: Diagnosis not present

## 2021-10-01 DIAGNOSIS — M7630 Iliotibial band syndrome, unspecified leg: Secondary | ICD-10-CM

## 2021-10-01 MED ORDER — OLMESARTAN MEDOXOMIL 20 MG PO TABS: 10.0000 mg | ORAL_TABLET | Freq: Every day | ORAL | 3 refills | Status: DC

## 2021-10-01 MED ORDER — ATORVASTATIN CALCIUM 10 MG PO TABS: 10.0000 mg | ORAL_TABLET | Freq: Every day | ORAL | 3 refills | Status: DC

## 2021-10-01 NOTE — Assessment & Plan Note (Signed)
Patient with history of hyperlipidemia with last lipid panel in December 2021 showing total cholesterol 229, HDL 73, LDL 131.  She was initiated on Lipitor 10 mg daily at that time.  Has not had a repeat lipid panel since so we will order one today.  Goal LDL is less than 100 for primary prevention.  Plan: -Continue Lipitor 10 mg daily -Follow-up lipid panel, goal LDL less than 100

## 2021-10-01 NOTE — Progress Notes (Signed)
° °  CC: Follow-up of hypertension  HPI:  Alexa Rodriguez is a 66 y.o. female with history listed below presenting to the [facility] for follow-up of hypertension. Please see individualized problem based charting for full HPI.  Past Medical History:  Diagnosis Date   ADHD 01/30/2020   Bradycardia on ECG 01/30/2020   Bradycardia on ECG 01/30/2020   Chest pain of uncertain etiology 5/59/7416   Depression 01/30/2020   Fibromyalgia 01/30/2020   Healthcare maintenance 08/11/2020   OSA (obstructive sleep apnea) 01/30/2020   Shortness of breath 09/23/2020   Stable angina ([place]) 08/09/2020    Review of Systems:  Negative aside from that listed in individualized problem based charting.  Physical Exam:  Vitals:   10/01/21 1406  BP: 126/69  Pulse: 75  Temp: 97.8 F (36.6 C)  TempSrc: Oral  SpO2: 100%  Weight: 201 lb 1.6 oz (91.2 kg)   Physical Exam Constitutional:      Appearance: She is obese. She is not ill-appearing.  HENT:     Mouth/Throat:     Mouth: Mucous membranes are moist.     Pharynx: Oropharynx is clear. No oropharyngeal exudate.  Eyes:     Extraocular Movements: Extraocular movements intact.     Conjunctiva/sclera: Conjunctivae normal.     Pupils: Pupils are equal, round, and reactive to light.  Cardiovascular:     Rate and Rhythm: Normal rate and regular rhythm.     Pulses: Normal pulses.     Heart sounds: Normal heart sounds.  Pulmonary:     Effort: Pulmonary effort is normal.     Breath sounds: Normal breath sounds. No wheezing, rhonchi or rales.  Abdominal:     General: Bowel sounds are normal. There is no distension.     Palpations: Abdomen is soft.     Tenderness: There is no abdominal tenderness.  Musculoskeletal:        General: No swelling. Normal range of motion.  Skin:    General: Skin is warm and dry.  Neurological:     General: No focal deficit present.     Mental Status: She is alert and oriented to person, place, and time.  Psychiatric:        Mood  and Affect: Mood normal.        Behavior: Behavior normal.     Assessment & Plan:   See Encounters Tab for problem based charting.  Patient discussed with Dr. Evette Doffing

## 2021-10-01 NOTE — Assessment & Plan Note (Signed)
Patient received her flu shot and pneumonia vaccine today.  Placed referral to GI for screening colonoscopy.  Ordered bone density scan to screen for osteoporosis in this 66 year old postmenopausal female.

## 2021-10-01 NOTE — Assessment & Plan Note (Signed)
Patient is now status post total knee replacement of right knee.  States that there was of possible complication during surgery that resulted in her experiencing symptoms consistent with IT band syndrome per surgery.  States that the surgical team is managing this issue.  She was started on Celebrex 200 mg daily and gabapentin 300 mg 3 times daily for pain control.  Will not make any adjustments given that this issue is being managed by the surgical team.  However, will discontinue her diclofenac tablets given that she is currently on diclofenac tablets, Celebrex, and Benicar all of which can collectively precipitate kidney dysfunction.  Plan: -Continue Celebrex and gabapentin -Discontinue diclofenac tablets

## 2021-10-01 NOTE — Assessment & Plan Note (Signed)
Patient with history of prediabetes, last A1c 5.8% in December 2021.  Repeating A1c today to check for progression.  Plan: -Follow-up A1c

## 2021-10-01 NOTE — Assessment & Plan Note (Signed)
Patient with history of fibromyalgia that causes diffuse muscle pain during flares.  She is currently on Cymbalta 60 mg daily, Robaxin 750 milligrams daily as needed, lidocaine patch, and diclofenac tablets as needed.  She was recently started on Celebrex and gabapentin after recent knee replacement.  States that the gabapentin has actually improved her fibromyalgia pain.  Discussed discontinuation of diclofenac tablets at this time given that she is on 2 NSAIDs (diclofenac and Celebrex) and also concomitantly taking Benicar.  She is agreeable to this.  Emphasized limiting use of Celebrex to only when pain becomes functionally limiting to prevent adverse effects of continued NSAID use.  She notes understanding.  Plan: -Continue Cymbalta, lidocaine patches, Robaxin as needed, Celebrex, gabapentin -Discontinued diclofenac tablets

## 2021-10-01 NOTE — Patient Instructions (Signed)
Alexa Rodriguez,  It was a pleasure seeing you in the clinic today.   We are getting some labs today to check your sugar levels and cholesterol. I have refilled your benicar (blood pressure) and lipitor (cholesterol). I have placed a referral for your colonoscopy. Someone will call you to schedule this. I have ordered your bone density scan to check for bone thinning. Someone will call you to schedule this as well. You got your flu shot and pneumonia vaccine today. Please come back in 3 months for follow up visit.  Please call our clinic at [phone] if you have any questions or concerns. The best time to call is Monday-Friday from 9am-4pm, but there is someone available 24/7 at the same number. If you need medication refills, please notify your pharmacy one week in advance and they will send [place] a request.   Thank you for letting us take part in your care. We look forward to seeing you next time!

## 2021-10-02 LAB — LIPID PANEL

## 2021-10-02 NOTE — Progress Notes (Signed)
Internal Medicine Clinic Attending  Case discussed with Dr. Jinwala  At the time of the visit.  We reviewed the resident's history and exam and pertinent patient test results.  I agree with the assessment, diagnosis, and plan of care documented in the resident's note.  

## 2021-10-03 LAB — LIPID PANEL
Chol/HDL Ratio: 2.9 ratio (ref 0.0–4.4)
Cholesterol, Total: 187 mg/dL (ref 100–199)
HDL: 64 mg/dL (ref >39)
LDL Chol Calc (NIH): 90 mg/dL (ref 0–99)
Triglycerides: 197 mg/dL — ABNORMAL HIGH (ref 0–149)
VLDL Cholesterol Cal: 33 mg/dL (ref 5–40)

## 2021-10-03 LAB — HEMOGLOBIN A1C
Est. average glucose Bld gHb Est-mCnc: 123 mg/dL
Hgb A1c MFr Bld: 5.9 % — ABNORMAL HIGH (ref 4.8–5.6)

## 2021-10-06 ENCOUNTER — Other Ambulatory Visit: Payer: Self-pay | Admitting: Student

## 2021-10-06 DIAGNOSIS — M797 Fibromyalgia: Secondary | ICD-10-CM

## 2021-10-06 DIAGNOSIS — M5416 Radiculopathy, lumbar region: Secondary | ICD-10-CM | POA: Diagnosis not present

## 2021-10-08 NOTE — Telephone Encounter (Signed)
Diclofenac has been discontinued.

## 2021-11-05 ENCOUNTER — Other Ambulatory Visit: Payer: Self-pay | Admitting: Student

## 2021-11-05 DIAGNOSIS — M5416 Radiculopathy, lumbar region: Secondary | ICD-10-CM | POA: Diagnosis not present

## 2021-11-05 DIAGNOSIS — M797 Fibromyalgia: Secondary | ICD-10-CM

## 2021-11-20 DIAGNOSIS — M9902 Segmental and somatic dysfunction of thoracic region: Secondary | ICD-10-CM | POA: Diagnosis not present

## 2021-11-20 DIAGNOSIS — M461 Sacroiliitis, not elsewhere classified: Secondary | ICD-10-CM | POA: Diagnosis not present

## 2021-11-20 DIAGNOSIS — M9903 Segmental and somatic dysfunction of lumbar region: Secondary | ICD-10-CM | POA: Diagnosis not present

## 2021-11-20 DIAGNOSIS — M9905 Segmental and somatic dysfunction of pelvic region: Secondary | ICD-10-CM | POA: Diagnosis not present

## 2021-11-20 DIAGNOSIS — M4726 Other spondylosis with radiculopathy, lumbar region: Secondary | ICD-10-CM | POA: Diagnosis not present

## 2021-11-26 DIAGNOSIS — M4726 Other spondylosis with radiculopathy, lumbar region: Secondary | ICD-10-CM | POA: Diagnosis not present

## 2021-11-26 DIAGNOSIS — M9905 Segmental and somatic dysfunction of pelvic region: Secondary | ICD-10-CM | POA: Diagnosis not present

## 2021-11-26 DIAGNOSIS — M461 Sacroiliitis, not elsewhere classified: Secondary | ICD-10-CM | POA: Diagnosis not present

## 2021-11-26 DIAGNOSIS — M9902 Segmental and somatic dysfunction of thoracic region: Secondary | ICD-10-CM | POA: Diagnosis not present

## 2021-11-26 DIAGNOSIS — M9903 Segmental and somatic dysfunction of lumbar region: Secondary | ICD-10-CM | POA: Diagnosis not present

## 2021-11-28 DIAGNOSIS — M461 Sacroiliitis, not elsewhere classified: Secondary | ICD-10-CM | POA: Diagnosis not present

## 2021-11-28 DIAGNOSIS — M9903 Segmental and somatic dysfunction of lumbar region: Secondary | ICD-10-CM | POA: Diagnosis not present

## 2021-11-28 DIAGNOSIS — M9905 Segmental and somatic dysfunction of pelvic region: Secondary | ICD-10-CM | POA: Diagnosis not present

## 2021-11-28 DIAGNOSIS — M4726 Other spondylosis with radiculopathy, lumbar region: Secondary | ICD-10-CM | POA: Diagnosis not present

## 2021-12-03 DIAGNOSIS — M4726 Other spondylosis with radiculopathy, lumbar region: Secondary | ICD-10-CM | POA: Diagnosis not present

## 2021-12-03 DIAGNOSIS — M9905 Segmental and somatic dysfunction of pelvic region: Secondary | ICD-10-CM | POA: Diagnosis not present

## 2021-12-03 DIAGNOSIS — M461 Sacroiliitis, not elsewhere classified: Secondary | ICD-10-CM | POA: Diagnosis not present

## 2021-12-03 DIAGNOSIS — M9903 Segmental and somatic dysfunction of lumbar region: Secondary | ICD-10-CM | POA: Diagnosis not present

## 2021-12-06 ENCOUNTER — Other Ambulatory Visit: Payer: Self-pay | Admitting: Student

## 2021-12-06 DIAGNOSIS — M797 Fibromyalgia: Secondary | ICD-10-CM

## 2021-12-10 DIAGNOSIS — M461 Sacroiliitis, not elsewhere classified: Secondary | ICD-10-CM | POA: Diagnosis not present

## 2021-12-10 DIAGNOSIS — M9903 Segmental and somatic dysfunction of lumbar region: Secondary | ICD-10-CM | POA: Diagnosis not present

## 2021-12-10 DIAGNOSIS — M4726 Other spondylosis with radiculopathy, lumbar region: Secondary | ICD-10-CM | POA: Diagnosis not present

## 2021-12-10 DIAGNOSIS — M9905 Segmental and somatic dysfunction of pelvic region: Secondary | ICD-10-CM | POA: Diagnosis not present

## 2021-12-11 ENCOUNTER — Ambulatory Visit
Admission: RE | Admit: 2021-12-11 | Discharge: 2021-12-11 | Disposition: A | Discharge status: Home / Self Care | Payer: Medicare HMO | Source: Ambulatory Visit | Attending: Internal Medicine | Admitting: Internal Medicine

## 2021-12-11 DIAGNOSIS — Z780 Asymptomatic menopausal state: Secondary | ICD-10-CM | POA: Diagnosis not present

## 2021-12-11 DIAGNOSIS — M8589 Other specified disorders of bone density and structure, multiple sites: Secondary | ICD-10-CM | POA: Diagnosis not present

## 2021-12-11 DIAGNOSIS — Z13820 Encounter for screening for osteoporosis: Secondary | ICD-10-CM

## 2021-12-12 DIAGNOSIS — M4726 Other spondylosis with radiculopathy, lumbar region: Secondary | ICD-10-CM | POA: Diagnosis not present

## 2021-12-12 DIAGNOSIS — M9905 Segmental and somatic dysfunction of pelvic region: Secondary | ICD-10-CM | POA: Diagnosis not present

## 2021-12-12 DIAGNOSIS — M9903 Segmental and somatic dysfunction of lumbar region: Secondary | ICD-10-CM | POA: Diagnosis not present

## 2021-12-12 DIAGNOSIS — M461 Sacroiliitis, not elsewhere classified: Secondary | ICD-10-CM | POA: Diagnosis not present

## 2021-12-16 DIAGNOSIS — Z6835 Body mass index (BMI) 35.0-35.9, adult: Secondary | ICD-10-CM | POA: Diagnosis not present

## 2021-12-16 DIAGNOSIS — M4316 Spondylolisthesis, lumbar region: Secondary | ICD-10-CM | POA: Diagnosis not present

## 2021-12-16 DIAGNOSIS — M48062 Spinal stenosis, lumbar region with neurogenic claudication: Secondary | ICD-10-CM | POA: Diagnosis not present

## 2021-12-17 DIAGNOSIS — M9905 Segmental and somatic dysfunction of pelvic region: Secondary | ICD-10-CM | POA: Diagnosis not present

## 2021-12-17 DIAGNOSIS — M4726 Other spondylosis with radiculopathy, lumbar region: Secondary | ICD-10-CM | POA: Diagnosis not present

## 2021-12-17 DIAGNOSIS — M461 Sacroiliitis, not elsewhere classified: Secondary | ICD-10-CM | POA: Diagnosis not present

## 2021-12-17 DIAGNOSIS — M9903 Segmental and somatic dysfunction of lumbar region: Secondary | ICD-10-CM | POA: Diagnosis not present

## 2021-12-19 DIAGNOSIS — M9905 Segmental and somatic dysfunction of pelvic region: Secondary | ICD-10-CM | POA: Diagnosis not present

## 2021-12-19 DIAGNOSIS — M9903 Segmental and somatic dysfunction of lumbar region: Secondary | ICD-10-CM | POA: Diagnosis not present

## 2021-12-19 DIAGNOSIS — M461 Sacroiliitis, not elsewhere classified: Secondary | ICD-10-CM | POA: Diagnosis not present

## 2021-12-19 DIAGNOSIS — M4726 Other spondylosis with radiculopathy, lumbar region: Secondary | ICD-10-CM | POA: Diagnosis not present

## 2021-12-24 DIAGNOSIS — M9905 Segmental and somatic dysfunction of pelvic region: Secondary | ICD-10-CM | POA: Diagnosis not present

## 2021-12-24 DIAGNOSIS — M461 Sacroiliitis, not elsewhere classified: Secondary | ICD-10-CM | POA: Diagnosis not present

## 2021-12-24 DIAGNOSIS — M9903 Segmental and somatic dysfunction of lumbar region: Secondary | ICD-10-CM | POA: Diagnosis not present

## 2021-12-24 DIAGNOSIS — M4726 Other spondylosis with radiculopathy, lumbar region: Secondary | ICD-10-CM | POA: Diagnosis not present

## 2021-12-26 DIAGNOSIS — M4726 Other spondylosis with radiculopathy, lumbar region: Secondary | ICD-10-CM | POA: Diagnosis not present

## 2021-12-26 DIAGNOSIS — M461 Sacroiliitis, not elsewhere classified: Secondary | ICD-10-CM | POA: Diagnosis not present

## 2021-12-26 DIAGNOSIS — M9905 Segmental and somatic dysfunction of pelvic region: Secondary | ICD-10-CM | POA: Diagnosis not present

## 2021-12-26 DIAGNOSIS — M9903 Segmental and somatic dysfunction of lumbar region: Secondary | ICD-10-CM | POA: Diagnosis not present

## 2022-01-22 ENCOUNTER — Other Ambulatory Visit: Payer: Self-pay | Admitting: Neurological Surgery

## 2022-01-30 ENCOUNTER — Encounter: Payer: Self-pay | Admitting: Student

## 2022-01-30 ENCOUNTER — Ambulatory Visit (HOSPITAL_COMMUNITY): Payer: Medicare HMO

## 2022-01-30 ENCOUNTER — Ambulatory Visit (HOSPITAL_COMMUNITY): Payer: Federal, State, Local not specified - PPO | Attending: Internal Medicine

## 2022-01-30 ENCOUNTER — Ambulatory Visit (INDEPENDENT_AMBULATORY_CARE_PROVIDER_SITE_OTHER): Payer: Federal, State, Local not specified - PPO | Admitting: Student

## 2022-01-30 VITALS — BP 138/86 | HR 66 | Temp 97.8°F | Ht 63.0 in | Wt 190.4 lb

## 2022-01-30 DIAGNOSIS — Z0000 Encounter for general adult medical examination without abnormal findings: Secondary | ICD-10-CM

## 2022-01-30 DIAGNOSIS — R7303 Prediabetes: Secondary | ICD-10-CM

## 2022-01-30 DIAGNOSIS — M8580 Other specified disorders of bone density and structure, unspecified site: Secondary | ICD-10-CM | POA: Diagnosis not present

## 2022-01-30 DIAGNOSIS — M797 Fibromyalgia: Secondary | ICD-10-CM | POA: Diagnosis not present

## 2022-01-30 DIAGNOSIS — Z01818 Encounter for other preprocedural examination: Secondary | ICD-10-CM | POA: Diagnosis not present

## 2022-01-30 DIAGNOSIS — R001 Bradycardia, unspecified: Secondary | ICD-10-CM | POA: Insufficient documentation

## 2022-01-30 DIAGNOSIS — I10 Essential (primary) hypertension: Secondary | ICD-10-CM

## 2022-01-30 DIAGNOSIS — Z780 Asymptomatic menopausal state: Secondary | ICD-10-CM | POA: Diagnosis not present

## 2022-01-30 DIAGNOSIS — Z87891 Personal history of nicotine dependence: Secondary | ICD-10-CM

## 2022-01-30 DIAGNOSIS — E782 Mixed hyperlipidemia: Secondary | ICD-10-CM

## 2022-01-30 NOTE — Assessment & Plan Note (Signed)
Patient with history of HTN, controlled with benicar '10mg'$  daily. Blood pressure initially elevated at 150/81 but decreased to 138/86 on repeat. Goal BP <140/90 for primary prevention. She does endorse that she has not yet taken her home medications, which likely explains mild elevation.   Plan: -continue benicar '10mg'$  daily

## 2022-01-30 NOTE — Patient Instructions (Signed)
Ms. Salmon,  It was a pleasure seeing you in the clinic today.   From our standpoint, you are cleared for your upcoming surgery. I hope that it goes well and that your symptoms improve afterwards. Keep up the good work! Please come back to see me in 4 months, or sooner if needed.  Please call our clinic at [phone] if you have any questions or concerns. The best time to call is Monday-Friday from 9am-4pm, but there is someone available 24/7 at the same number. If you need medication refills, please notify your pharmacy one week in advance and they will send [place] a request.   Thank you for letting us take part in your care. We look forward to seeing you next time!

## 2022-01-30 NOTE — Assessment & Plan Note (Signed)
Patient with history of prediabetes, last A1c 5.9% in Februrary 2023. She has limited sugar intake and does not eat processed foods. She has a big garden at home which she tends to and grows her own food there. She has lost 11 lbs since last visit in February, which I commended her for.

## 2022-01-30 NOTE — Assessment & Plan Note (Signed)
States she was contacted by GI for screening colonoscopy and needs to call them back to schedule this. Discussed importance of colon cancer screening and she understands.  She had a hysterectomy at age 66 after experiencing uterine prolapse. She has not had any abnormal PAP smears prior to hysterectomy. Will discontinue cervical cancer screening given normal prior history and s/p hysterectomy status.

## 2022-01-30 NOTE — Progress Notes (Signed)
   CC: evaluation for pre-operative clearance  HPI:  [name] is a 66 y.o. female with history listed below presenting to the [facility] for evaluation for pre-operative clearance. Please see individualized problem based charting for full HPI.  Past Medical History:  Diagnosis Date   ADHD 01/30/2020   Depression 01/30/2020   Fibromyalgia 01/30/2020   Healthcare maintenance 08/11/2020   Insomnia 10/21/2020   Murmur 09/23/2020   OSA (obstructive sleep apnea) 01/30/2020   Stable angina ([place]) 08/09/2020    Review of Systems:  Negative aside from that listed in individualized problem based charting.  Physical Exam:  Vitals:   01/30/22 1103 01/30/22 1130  BP: (!) 150/81 138/86  Pulse: (!) 56 66  Temp: 97.8 F (36.6 C)   TempSrc: Oral   SpO2: 100%   Weight: 190 lb 6.4 oz (86.4 kg)   Height: '5\' 3"'$  (1.6 m)    Physical Exam Constitutional:      Appearance: Normal appearance. She is obese. She is not ill-appearing.  HENT:     Nose: Nose normal.     Mouth/Throat:     Mouth: Mucous membranes are moist.     Pharynx: Oropharynx is clear.  Eyes:     Extraocular Movements: Extraocular movements intact.     Conjunctiva/sclera: Conjunctivae normal.     Pupils: Pupils are equal, round, and reactive to light.  Cardiovascular:     Rate and Rhythm: Regular rhythm. Bradycardia present.     Pulses: Normal pulses.     Heart sounds: Normal heart sounds. No murmur heard.    No friction rub. No gallop.  Pulmonary:     Effort: Pulmonary effort is normal.     Breath sounds: Normal breath sounds. No wheezing, rhonchi or rales.  Abdominal:     General: Bowel sounds are normal. There is no distension.     Palpations: Abdomen is soft.     Tenderness: There is no abdominal tenderness.  Musculoskeletal:     Cervical back: Normal range of motion.     Comments: Decreased ROM with flexion and extension of lumbar region. No tenderness to palpation or overt deformity noted.   Skin:     General: Skin is warm and dry.  Neurological:     General: No focal deficit present.     Mental Status: She is alert and oriented to person, place, and time.  Psychiatric:        Mood and Affect: Mood normal.        Behavior: Behavior normal.      Assessment & Plan:   See Encounters Tab for problem based charting.  Patient discussed with Dr. Dareen Piano

## 2022-01-30 NOTE — Assessment & Plan Note (Signed)
Patient with history of hyperlipidemia, on lipitor '10mg'$  daily. Last lipid panel in February 2023 showing LDL 90, which is at goal for primary prevention. She has decreased processed foods from her diet and only eats homegrown food. Has lost 11 lbs since last office visit in February, which I commended her for.

## 2022-01-30 NOTE — Assessment & Plan Note (Signed)
Patient with history of fibromyalgia, currently well controlled with lyrica, cymbalta, robaxin prn, celebrex, and intermittent lidocaine patch. 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 She reports having great symptom control after addition of lyrica.   Plan: -continue current medications

## 2022-01-30 NOTE — Assessment & Plan Note (Signed)
Patient is schedule for minimally invasive L4/L5 transforaminal lumbar interbody fusion with Dr. Zada Finders on 02/09/2022. She has a history of minimal CAD (coronary calcium score of 1 in 2022) and stable angina. Last ECHO showed G1DD but no other abnormalities. She is going for an intermediate risk surgery (orthopedic). 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 She did have sinus bradycardia noted on a prior EKG and initial HR today of 56 (improved to 66 on repeat). Given history of minimal CAD, stable angina, and mild bradycardia on prior EKG, performed an EKG today to ensure no abnormalities. EKG showing normal sinus rhythm with no concerning abnormalities. From my standpoint, she is medically cleared for her orthopedic surgery.

## 2022-02-02 NOTE — Progress Notes (Signed)
Surgical Instructions    Your procedure is scheduled on Monday, June 19th, 2023.   Report to [facility] Main Entrance "A" at 08:00 A.M., then check in with the Admitting office.  Call this number if you have problems the morning of surgery:  [phone]   If you have any questions prior to your surgery date call [phone]: Open Monday-Friday 8am-4pm    Remember:  Do not eat after midnight the night before your surgery  You may drink clear liquids until 07:00 the morning of your surgery.   Clear liquids allowed are: Water, Non-Citrus Juices (without pulp), Carbonated Beverages, Clear Tea, Black Coffee ONLY (NO MILK, CREAM OR POWDERED CREAMER of any kind), and Gatorade    Take these medicines the morning of surgery with A SIP OF WATER:   atorvastatin (LIPITOR)  carboxymethylcellulose (REFRESH PLUS) DULoxetine (CYMBALTA) pregabalin (LYRICA)  If needed:  fluorometholone (FML) methocarbamol (ROBAXIN)  nitroGLYCERIN (NITROSTAT)  As of today, STOP taking any Aspirin (unless otherwise instructed by your surgeon) Aleve, Naproxen, Ibuprofen, Motrin, Advil, Goody's, BC's, all herbal medications, fish oil, and all vitamins.    The day of surgery:          Do not wear jewelry or makeup Do not wear lotions, powders, perfumes, or deodorant. Do not shave 48 hours prior to surgery.   Do not bring valuables to the hospital. Do not wear nail polish, gel polish, artificial nails, or any other type of covering on natural nails (fingers and toes) If you have artificial nails or gel coating that need to be removed by a nail salon, please have this removed prior to surgery. Artificial nails or gel coating may interfere with anesthesia's ability to adequately monitor your vital signs.  [facility] is not responsible for any belongings or valuables. .   Do NOT Smoke (Tobacco/Vaping)  24 hours prior to your procedure  If you use a CPAP at night, you may bring your mask for your overnight  stay.   Contacts, glasses, hearing aids, dentures or partials may not be worn into surgery, please bring cases for these belongings   For patients admitted to the hospital, discharge time will be determined by your treatment team.   Patients discharged the day of surgery will not be allowed to drive home, and someone needs to stay with them for 24 hours.   SURGICAL WAITING ROOM VISITATION Patients having surgery or a procedure in a hospital may have two support people. Children under the age of 2 must have an adult with them who is not the patient. They may stay in the waiting area during the procedure and may switch out with other visitors. If the patient needs to stay at the hospital during part of their recovery, the visitor guidelines for inpatient rooms apply.  Please refer to the [facility] website for the visitor guidelines for Inpatients (after your surgery is over and you are in a regular room).    Special instructions:    Oral Hygiene is also important to reduce your risk of infection.  Remember - BRUSH YOUR TEETH THE MORNING OF SURGERY WITH YOUR REGULAR TOOTHPASTE   [facility]- Preparing For Surgery  Before surgery, you can play an important role. Because skin is not sterile, your skin needs to be as free of germs as possible. You can reduce the number of germs on your skin by washing with CHG (chlorahexidine gluconate) Soap before surgery.  CHG is an antiseptic cleaner which kills germs and bonds with the skin  to continue killing germs even after washing.     Please do not use if you have an allergy to CHG or antibacterial soaps. If your skin becomes reddened/irritated stop using the CHG.  Do not shave (including legs and underarms) for at least 48 hours prior to first CHG shower. It is OK to shave your face.  Please follow these instructions carefully.     Shower the NIGHT BEFORE SURGERY and the MORNING OF SURGERY with CHG Soap.   If you chose to wash your hair, wash  your hair first as usual with your normal shampoo. After you shampoo, rinse your hair and body thoroughly to remove the shampoo.  Then [employer] and genitals (private parts) with your normal soap and rinse thoroughly to remove soap.  After that Use CHG Soap as you would any other liquid soap. You can apply CHG directly to the skin and wash gently with a scrungie or a clean washcloth.   Apply the CHG Soap to your body ONLY FROM THE NECK DOWN.  Do not use on open wounds or open sores. Avoid contact with your eyes, ears, mouth and genitals (private parts). Wash Face and genitals (private parts)  with your normal soap.   Wash thoroughly, paying special attention to the area where your surgery will be performed.  Thoroughly rinse your body with warm water from the neck down.  DO NOT shower/wash with your normal soap after using and rinsing off the CHG Soap.  Pat yourself dry with a CLEAN TOWEL.  Wear CLEAN PAJAMAS to bed the night before surgery  Place CLEAN SHEETS on your bed the night before your surgery  DO NOT SLEEP WITH PETS.   Day of Surgery:  Take a shower with CHG soap. Wear Clean/Comfortable clothing the morning of surgery Do not apply any deodorants/lotions.   Remember to brush your teeth WITH YOUR REGULAR TOOTHPASTE.    If you received a COVID test during your pre-op visit, it is requested that you wear a mask when out in public, stay away from anyone that may not be feeling well, and notify your surgeon if you develop symptoms. If you have been in contact with anyone that has tested positive in the last 10 days, please notify your surgeon.    Please read over the following fact sheets that you were given.

## 2022-02-02 NOTE — Progress Notes (Signed)
Internal Medicine Clinic Attending  Case discussed with Dr. Jinwala  At the time of the visit.  We reviewed the resident's history and exam and pertinent patient test results.  I agree with the assessment, diagnosis, and plan of care documented in the resident's note.  

## 2022-02-03 ENCOUNTER — Other Ambulatory Visit: Payer: Self-pay

## 2022-02-03 ENCOUNTER — Encounter (HOSPITAL_COMMUNITY): Payer: Self-pay

## 2022-02-03 ENCOUNTER — Encounter (HOSPITAL_COMMUNITY)
Admission: RE | Admit: 2022-02-03 | Discharge: 2022-02-03 | Disposition: A | Discharge status: Home / Self Care | Payer: Federal, State, Local not specified - PPO | Source: Ambulatory Visit | Attending: Neurological Surgery | Admitting: Neurological Surgery

## 2022-02-03 VITALS — BP 147/76 | HR 75 | Temp 97.6°F | Resp 17 | Ht 62.0 in | Wt 189.3 lb

## 2022-02-03 DIAGNOSIS — Z87891 Personal history of nicotine dependence: Secondary | ICD-10-CM | POA: Insufficient documentation

## 2022-02-03 DIAGNOSIS — M797 Fibromyalgia: Secondary | ICD-10-CM | POA: Diagnosis not present

## 2022-02-03 DIAGNOSIS — G4733 Obstructive sleep apnea (adult) (pediatric): Secondary | ICD-10-CM | POA: Diagnosis not present

## 2022-02-03 DIAGNOSIS — I2510 Atherosclerotic heart disease of native coronary artery without angina pectoris: Secondary | ICD-10-CM | POA: Diagnosis not present

## 2022-02-03 DIAGNOSIS — I119 Hypertensive heart disease without heart failure: Secondary | ICD-10-CM | POA: Insufficient documentation

## 2022-02-03 DIAGNOSIS — M48062 Spinal stenosis, lumbar region with neurogenic claudication: Secondary | ICD-10-CM | POA: Diagnosis not present

## 2022-02-03 DIAGNOSIS — Z01812 Encounter for preprocedural laboratory examination: Secondary | ICD-10-CM | POA: Diagnosis not present

## 2022-02-03 DIAGNOSIS — Z01818 Encounter for other preprocedural examination: Secondary | ICD-10-CM

## 2022-02-03 DIAGNOSIS — F419 Anxiety disorder, unspecified: Secondary | ICD-10-CM | POA: Insufficient documentation

## 2022-02-03 HISTORY — DX: Anxiety disorder, unspecified: F41.9

## 2022-02-03 HISTORY — DX: Essential (primary) hypertension: I10

## 2022-02-03 HISTORY — DX: Pneumonia, unspecified organism: J18.9

## 2022-02-03 LAB — CBC
HCT: 39 % (ref 36.0–46.0)
Hemoglobin: 12.4 g/dL (ref 12.0–15.0)
MCH: 28.6 pg (ref 26.0–34.0)
MCHC: 31.8 g/dL (ref 30.0–36.0)
MCV: 90.1 fL (ref 80.0–100.0)
Platelets: 229 10*3/uL (ref 150–400)
RBC: 4.33 MIL/uL (ref 3.87–5.11)
RDW: 14.8 % (ref 11.5–15.5)
WBC: 4.5 10*3/uL (ref 4.0–10.5)
nRBC: 0 % (ref 0.0–0.2)

## 2022-02-03 LAB — SURGICAL PCR SCREEN
MRSA, PCR: NEGATIVE
Staphylococcus aureus: NEGATIVE

## 2022-02-03 LAB — BASIC METABOLIC PANEL
Anion gap: 6 (ref 5–15)
BUN: 15 mg/dL (ref 8–23)
CO2: 26 mmol/L (ref 22–32)
Calcium: 9.5 mg/dL (ref 8.9–10.3)
Chloride: 108 mmol/L (ref 98–111)
Creatinine, Ser: 0.78 mg/dL (ref 0.44–1.00)
GFR, Estimated: >60 mL/min (ref >60)
Glucose, Bld: 122 mg/dL — ABNORMAL HIGH (ref 70–99)
Potassium: 3.5 mmol/L (ref 3.5–5.1)
Sodium: 140 mmol/L (ref 135–145)

## 2022-02-03 LAB — NO BLOOD PRODUCTS

## 2022-02-03 NOTE — Progress Notes (Signed)
PCP -  Virl Axe, MD Cardiologist - Berniece Salines, DO  PPM/ICD - denies Device Orders - n/a Rep Notified - n/a  Chest x-ray - n/a EKG - 01/30/2022 Stress Test - denies ECHO - 10/21/2020 Cardiac Cath - 2004 in [place] - no records available per patient  Sleep Study - yes, positive for OSA CPAP - yes  Fasting Blood Sugar - n/a  Blood Thinner Instructions: n/a  Aspirin Instructions: Patient was instructed: As of today, STOP taking any Aspirin (unless otherwise instructed by your surgeon) Aleve, Naproxen, Ibuprofen, Motrin, Advil, Goody's, BC's, all herbal medications, fish oil, and all vitamins.  ERAS Protcol - yes, until 07:00 o'clock  COVID TEST- n.a   Anesthesia review: yes - history of heart murmur; pre-op clearance on 01/30/2022  Patient denies shortness of breath, fever, cough and chest pain at PAT appointment   All instructions explained to the patient, with a verbal understanding of the material. Patient agrees to go over the instructions while at home for a better understanding. Patient also instructed to self quarantine after being tested for COVID-19. The opportunity to ask questions was provided.

## 2022-02-04 NOTE — Progress Notes (Signed)
Anesthesia Chart Review:  Case: [id number] Date/Time: 02/09/22 0945   Procedure: L4-5 MIS TLIF/Metrx   Anesthesia type: General   Pre-op diagnosis: Lumbar stenosis with neurogenic claudication   Location: [facility] OR ROOM 20 / [facility]   Surgeons: Judith Part, MD       DISCUSSION: Patient is a 66 year old female scheduled for the above procedure.  History includes former smoker (quit 08/24/09), HTN, chest pain history (minimal CAD with coronary calcium score of 1 10/07/20 CCTA), murmur (trivial MR, mild AV sclerosis without stenosis 09/2020 echo), OSA (uses CPAP), fibromyalgia, anxiety, ADHD.  Preoperative medical evaluation by Dr. Allyson Sabal on 01/30/22. He wrote, "Patient is schedule for minimally invasive L4/L5 transforaminal lumbar interbody fusion with Dr. Zada Finders on 02/09/2022. She has a history of minimal CAD (coronary calcium score of 1 in 2022) and stable angina. Last ECHO showed G1DD but no other abnormalities. She is going for an intermediate risk surgery (orthopedic). 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 She did have sinus bradycardia noted on a prior EKG and initial HR today of 56 (improved to 66 on repeat). Given history of minimal CAD, stable angina, and mild bradycardia on prior EKG, performed an EKG today to ensure no abnormalities. EKG showing normal sinus rhythm with no concerning abnormalities. From my standpoint, she is medically cleared for her orthopedic surgery."  Last cardiology visit with Dr. Harriet Masson on 12/23/20 to review reassuring echo and CCTA results.   Anesthesia team to evaluate on the day of surgery.   VS: BP (!) 147/76   Pulse 75   Temp 36.4 C (Oral)   Resp 17   Ht '5\' 2"'$  (1.575 m)   Wt 85.9 kg   SpO2 100%   BMI 34.62 kg/m   PROVIDERS: Virl Axe, MD is PCP  Berniece Salines, DO is cardiologist   LABS: Labs reviewed: Acceptable for surgery. A1c 5.9% 10/01/21. (all labs ordered are listed, but only abnormal  results are displayed)  Labs Reviewed  BASIC METABOLIC PANEL - Abnormal; Notable for the following components:      Result Value   Glucose, Bld 122 (*)    All other components within normal limits  SURGICAL PCR SCREEN  CBC  NO BLOOD PRODUCTS     EKG: 01/30/22: NSR   CV: Echo 10/21/20: IMPRESSIONS   1. Left ventricular ejection fraction, by estimation, is 60 to 65%. The  left ventricle has normal function. The left ventricle has no regional  wall motion abnormalities. There is mild concentric left ventricular  hypertrophy. Left ventricular diastolic  parameters are consistent with Grade I diastolic dysfunction (impaired  relaxation).   2. Right ventricular systolic function is normal. The right ventricular  size is normal. There is normal pulmonary artery systolic pressure.   3. The mitral valve is normal in structure. Trivial mitral valve  regurgitation. No evidence of mitral stenosis.   4. The aortic valve is normal in structure. Aortic valve regurgitation is  not visualized. Mild aortic valve sclerosis is present, with no evidence  of aortic valve stenosis.   5. The inferior vena cava is normal in size with greater than 50%  respiratory variability, suggesting right atrial pressure of 3 mmHg.    CT Coronary 10/07/20:  Aorta: Normal size.  No calcifications.  No dissection.  Aortic Valve:  Trileaflet.  No calcifications. Coronary calcium score 1 Coronary Arteries:  Normal coronary origin.  Left  dominance. - RCA is a small non-dominant  artery.  There is no plaque. - Left main is a large artery that gives rise to LAD and LCX arteries. - LAD is a large vessel that has no plaque. - LCX is a dominant artery that gives rise to one large OM1 branch, and PDA. There is minimal (<24%) in the mid LCX. The proximal and distal LCX with no plaques. Other findings: - Normal pulmonary vein drainage into the left atrium. - Normal left atrial appendage without a thrombus. - Normal size  of the pulmonary artery. IMPRESSION: 1. Coronary calcium score of 1. This was 28 percentile for age and sex matched control. 2. Normal coronary origin with left dominance. 3. Minimal CAD. CADRADS 1. Recommend medical therapy for primary prevention.    Prior cardiac cath in [place] in 2004, reportedly showing no CAD.   Past Medical History:  Diagnosis Date   ADHD 01/30/2020   Anxiety    Depression 01/30/2020   Fibromyalgia 01/30/2020   Healthcare maintenance 08/11/2020   Hypertension    Insomnia 10/21/2020   Murmur 09/23/2020   OSA (obstructive sleep apnea) 01/30/2020   Pneumonia    Stable angina ([place]) 08/09/2020    Past Surgical History:  Procedure Laterality Date   BREAST LUMPECTOMY Left 1990   benign   CARDIAC CATHETERIZATION     2004 in [place]     right knee   TONSILLECTOMY      MEDICATIONS:  OVER THE COUNTER MEDICATION   atorvastatin (LIPITOR) 10 MG tablet   carboxymethylcellulose (REFRESH PLUS) 0.5 % SOLN   celecoxib (CELEBREX) 200 MG capsule   COLLAGEN PO   DULoxetine (CYMBALTA) 60 MG capsule   fluorometholone (FML) 0.1 % ophthalmic ointment   lidocaine (LIDODERM) 5 %   methocarbamol (ROBAXIN) 750 MG tablet   Multiple Vitamins-Minerals (MULTIVITAMIN WITH MINERALS) tablet   nitroGLYCERIN (NITROSTAT) 0.4 MG SL tablet   olmesartan (BENICAR) 20 MG tablet   OVER THE COUNTER MEDICATION   pregabalin (LYRICA) 50 MG capsule   Vitamin D-Vitamin K (K2 PLUS D3 PO)   No current facility-administered medications for this encounter.    Myra Gianotti, PA-C Surgical Short Stay/Anesthesiology [facility] Phone [phone] [facility] Phone [phone] 02/04/2022 2:48 PM

## 2022-02-04 NOTE — Anesthesia Preprocedure Evaluation (Addendum)
Anesthesia Evaluation  Patient identified by MRN, date of birth, ID band Patient awake    Reviewed: Allergy & Precautions, NPO status , Patient's Chart, lab work & pertinent test results  History of Anesthesia Complications Negative for: history of anesthetic complications  Airway Mallampati: IV  TM Distance: <3 FB Neck ROM: Limited  Mouth opening: Limited Mouth Opening  Dental  (+) Teeth Intact, Dental Advisory Given, Chipped,    Pulmonary neg shortness of breath, sleep apnea and Continuous Positive Airway Pressure Ventilation , neg COPD, neg recent URI, former smoker,    breath sounds clear to auscultation       Cardiovascular hypertension, Pt. on medications (-) angina+ CAD  (-) Cardiac Stents and (-) CABG + Valvular Problems/Murmurs AS  Rhythm:Regular  Echo 10/21/20: 1. Left ventricular ejection fraction, by estimation, is 60 to 65%. The  left ventricle has normal function. The left ventricle has no regional  wall motion abnormalities. There is mild concentric left ventricular  hypertrophy. Left ventricular diastolic  parameters are consistent with Grade I diastolic dysfunction (impaired  relaxation).  2. Right ventricular systolic function is normal. The right ventricular  size is normal. There is normal pulmonary artery systolic pressure.  3. The mitral valve is normal in structure. Trivial mitral valve  regurgitation. No evidence of mitral stenosis.  4. The aortic valve is normal in structure. Aortic valve regurgitation is  not visualized. Mild aortic valve sclerosis is present, with no evidence  of aortic valve stenosis.  5. The inferior vena cava is normal in size with greater than 50%  respiratory variability, suggesting right atrial pressure of 3 mmHg.    CT Coronary 10/07/20:  1. Coronary calcium score of 1. This was 51 percentile for age and sex matched control. 2. Normal coronary origin with left  dominance. 3. Minimal CAD. CADRADS 1. Recommend medical therapy for primary prevention.    Neuro/Psych PSYCHIATRIC DISORDERS Anxiety Depression  Neuromuscular disease    GI/Hepatic Neg liver ROS, hiatal hernia,   Endo/Other  negative endocrine ROS  Renal/GU negative Renal ROSLab Results      Component                Value               Date                      CREATININE               0.78                02/03/2022                Musculoskeletal  (+) Fibromyalgia -  Abdominal   Peds  Hematology negative hematology ROS (+)   Anesthesia Other Findings   Reproductive/Obstetrics                           Anesthesia Physical Anesthesia Plan  ASA: 2  Anesthesia Plan: General   Post-op Pain Management: Ofirmev IV (intra-op)* and Ketamine IV*   Induction: Intravenous  PONV Risk Score and Plan: 3 and Ondansetron and Dexamethasone  Airway Management Planned: Oral ETT and Video Laryngoscope Planned  Additional Equipment: None  Intra-op Plan:   Post-operative Plan: Extubation in OR  Informed Consent: I have reviewed the patients History and Physical, chart, labs and discussed the procedure including the risks, benefits and alternatives for the proposed anesthesia with the patient  or authorized representative who has indicated his/her understanding and acceptance.     Dental advisory given  Plan Discussed with: CRNA  Anesthesia Plan Comments: (PAT note written 02/04/2022 by Myra Gianotti, PA-C. )      Anesthesia Quick Evaluation

## 2022-02-06 NOTE — Progress Notes (Signed)
Pt made aware of the surgery time change 0730-1100, arrival 0530, and finish drinking clear liquids by 0430.

## 2022-02-09 ENCOUNTER — Other Ambulatory Visit: Payer: Self-pay

## 2022-02-09 ENCOUNTER — Encounter (HOSPITAL_COMMUNITY): Payer: Self-pay | Admitting: Neurological Surgery

## 2022-02-09 ENCOUNTER — Encounter (HOSPITAL_COMMUNITY)
Admission: AD | Disposition: A | Discharge status: Home / Self Care | Payer: Self-pay | Source: Ambulatory Visit | Attending: Neurological Surgery

## 2022-02-09 ENCOUNTER — Ambulatory Visit (HOSPITAL_COMMUNITY): Payer: Federal, State, Local not specified - PPO | Admitting: Vascular Surgery

## 2022-02-09 ENCOUNTER — Observation Stay (HOSPITAL_COMMUNITY)
Admission: AD | Admit: 2022-02-09 | Discharge: 2022-02-10 | Disposition: A | Discharge status: Home / Self Care | Payer: Federal, State, Local not specified - PPO | Source: Ambulatory Visit | Attending: Neurological Surgery | Admitting: Neurological Surgery

## 2022-02-09 ENCOUNTER — Ambulatory Visit (HOSPITAL_COMMUNITY): Payer: Federal, State, Local not specified - PPO

## 2022-02-09 ENCOUNTER — Ambulatory Visit (HOSPITAL_COMMUNITY): Payer: Federal, State, Local not specified - PPO | Admitting: Certified Registered Nurse Anesthetist

## 2022-02-09 DIAGNOSIS — M4316 Spondylolisthesis, lumbar region: Secondary | ICD-10-CM | POA: Diagnosis not present

## 2022-02-09 DIAGNOSIS — M48062 Spinal stenosis, lumbar region with neurogenic claudication: Secondary | ICD-10-CM

## 2022-02-09 DIAGNOSIS — M4326 Fusion of spine, lumbar region: Secondary | ICD-10-CM | POA: Diagnosis not present

## 2022-02-09 DIAGNOSIS — I10 Essential (primary) hypertension: Secondary | ICD-10-CM | POA: Diagnosis not present

## 2022-02-09 DIAGNOSIS — Z87891 Personal history of nicotine dependence: Secondary | ICD-10-CM | POA: Insufficient documentation

## 2022-02-09 DIAGNOSIS — Z01818 Encounter for other preprocedural examination: Secondary | ICD-10-CM

## 2022-02-09 DIAGNOSIS — Z981 Arthrodesis status: Secondary | ICD-10-CM | POA: Diagnosis not present

## 2022-02-09 DIAGNOSIS — M797 Fibromyalgia: Secondary | ICD-10-CM

## 2022-02-09 HISTORY — PX: TRANSFORAMINAL LUMBAR INTERBODY FUSION W/ MIS 1 LEVEL: SHX6145

## 2022-02-09 LAB — NO BLOOD PRODUCTS

## 2022-02-09 SURGERY — MINIMALLY INVASIVE (MIS) TRANSFORAMINAL LUMBAR INTERBODY FUSION (TLIF) 1 LEVEL
Anesthesia: General | Site: Spine Lumbar

## 2022-02-09 MED ORDER — ORAL CARE MOUTH RINSE: 15.0000 mL | Freq: Once | OROMUCOSAL | Status: AC

## 2022-02-09 MED ORDER — ACETAMINOPHEN 10 MG/ML IV SOLN
INTRAVENOUS | Status: AC
  Filled 2022-02-09: qty 100

## 2022-02-09 MED ORDER — ACETAMINOPHEN 10 MG/ML IV SOLN: 1000.0000 mg | Freq: Once | INTRAVENOUS | Status: DC | PRN

## 2022-02-09 MED ORDER — DULOXETINE HCL 30 MG PO CPEP
60.0000 mg | ORAL_CAPSULE | Freq: Every day | ORAL | Status: DC
  Administered 2022-02-10: 60 mg via ORAL
  Filled 2022-02-09: qty 2

## 2022-02-09 MED ORDER — FENTANYL CITRATE (PF) 100 MCG/2ML IJ SOLN
INTRAMUSCULAR | Status: AC
  Filled 2022-02-09: qty 2

## 2022-02-09 MED ORDER — ACETAMINOPHEN 10 MG/ML IV SOLN
INTRAVENOUS | Status: DC | PRN
  Administered 2022-02-09: 1000 mg via INTRAVENOUS

## 2022-02-09 MED ORDER — HYDROMORPHONE HCL 1 MG/ML IJ SOLN
1.0000 mg | INTRAMUSCULAR | Status: DC | PRN
  Administered 2022-02-09: 1 mg via INTRAVENOUS
  Filled 2022-02-09: qty 1

## 2022-02-09 MED ORDER — CEFAZOLIN SODIUM-DEXTROSE 2-4 GM/100ML-% IV SOLN
2.0000 g | INTRAVENOUS | Status: AC
  Administered 2022-02-09: 2 g via INTRAVENOUS
  Filled 2022-02-09: qty 100

## 2022-02-09 MED ORDER — KETAMINE HCL 50 MG/5ML IJ SOSY
PREFILLED_SYRINGE | INTRAMUSCULAR | Status: AC
Start: 2022-02-09 — End: ?
  Filled 2022-02-09: qty 5

## 2022-02-09 MED ORDER — OXYCODONE HCL 5 MG PO TABS: 5.0000 mg | ORAL_TABLET | Freq: Once | ORAL | Status: DC | PRN

## 2022-02-09 MED ORDER — CARBOXYMETHYLCELLULOSE SODIUM 0.5 % OP SOLN: 1.0000 [drp] | Freq: Every day | OPHTHALMIC | Status: DC

## 2022-02-09 MED ORDER — ACETAMINOPHEN 500 MG PO TABS: 1000.0000 mg | ORAL_TABLET | Freq: Once | ORAL | Status: DC | PRN

## 2022-02-09 MED ORDER — POLYETHYLENE GLYCOL 3350 17 G PO PACK: 17.0000 g | PACK | Freq: Every day | ORAL | Status: DC | PRN

## 2022-02-09 MED ORDER — ACETAMINOPHEN 325 MG PO TABS: 650.0000 mg | ORAL_TABLET | ORAL | Status: DC | PRN

## 2022-02-09 MED ORDER — ROCURONIUM BROMIDE 10 MG/ML (PF) SYRINGE
PREFILLED_SYRINGE | INTRAVENOUS | Status: AC
  Filled 2022-02-09: qty 20

## 2022-02-09 MED ORDER — DEXAMETHASONE SODIUM PHOSPHATE 10 MG/ML IJ SOLN
INTRAMUSCULAR | Status: DC | PRN
  Administered 2022-02-09: 4 mg via INTRAVENOUS

## 2022-02-09 MED ORDER — KETAMINE HCL 10 MG/ML IJ SOLN
INTRAMUSCULAR | Status: DC | PRN
  Administered 2022-02-09 (×2): 10 mg via INTRAVENOUS
  Administered 2022-02-09: 30 mg via INTRAVENOUS

## 2022-02-09 MED ORDER — PREGABALIN 25 MG PO CAPS
50.0000 mg | ORAL_CAPSULE | Freq: Two times a day (BID) | ORAL | Status: DC
  Administered 2022-02-09 – 2022-02-10 (×3): 50 mg via ORAL
  Filled 2022-02-09 (×3): qty 2

## 2022-02-09 MED ORDER — OXYCODONE HCL 5 MG PO TABS
10.0000 mg | ORAL_TABLET | ORAL | Status: DC | PRN
  Administered 2022-02-09 – 2022-02-10 (×5): 10 mg via ORAL
  Filled 2022-02-09 (×5): qty 2

## 2022-02-09 MED ORDER — ROCURONIUM BROMIDE 10 MG/ML (PF) SYRINGE
PREFILLED_SYRINGE | INTRAVENOUS | Status: DC | PRN
  Administered 2022-02-09: 90 mg via INTRAVENOUS
  Administered 2022-02-09 (×3): 20 mg via INTRAVENOUS

## 2022-02-09 MED ORDER — CEFAZOLIN SODIUM-DEXTROSE 2-4 GM/100ML-% IV SOLN
2.0000 g | Freq: Three times a day (TID) | INTRAVENOUS | Status: AC
  Administered 2022-02-09 (×2): 2 g via INTRAVENOUS
  Filled 2022-02-09 (×2): qty 100

## 2022-02-09 MED ORDER — SODIUM CHLORIDE 0.9% FLUSH: 3.0000 mL | INTRAVENOUS | Status: DC | PRN

## 2022-02-09 MED ORDER — METHOCARBAMOL 750 MG PO TABS
750.0000 mg | ORAL_TABLET | Freq: Four times a day (QID) | ORAL | Status: DC | PRN
  Administered 2022-02-09 – 2022-02-10 (×3): 750 mg via ORAL
  Filled 2022-02-09 (×3): qty 1

## 2022-02-09 MED ORDER — FENTANYL CITRATE (PF) 100 MCG/2ML IJ SOLN
25.0000 ug | INTRAMUSCULAR | Status: DC | PRN
  Administered 2022-02-09: 25 ug via INTRAVENOUS
  Administered 2022-02-09: 50 ug via INTRAVENOUS
  Administered 2022-02-09 (×3): 25 ug via INTRAVENOUS

## 2022-02-09 MED ORDER — PROPOFOL 10 MG/ML IV BOLUS
INTRAVENOUS | Status: DC | PRN
  Administered 2022-02-09: 120 mg via INTRAVENOUS

## 2022-02-09 MED ORDER — FENTANYL CITRATE (PF) 250 MCG/5ML IJ SOLN
INTRAMUSCULAR | Status: AC
  Filled 2022-02-09: qty 5

## 2022-02-09 MED ORDER — LACTATED RINGERS IV SOLN: INTRAVENOUS | Status: DC

## 2022-02-09 MED ORDER — DOCUSATE SODIUM 100 MG PO CAPS
100.0000 mg | ORAL_CAPSULE | Freq: Two times a day (BID) | ORAL | Status: DC
Start: 2022-02-09 — End: 2022-02-10
  Administered 2022-02-09 – 2022-02-10 (×2): 100 mg via ORAL
  Filled 2022-02-09 (×2): qty 1

## 2022-02-09 MED ORDER — SODIUM CHLORIDE 0.9 % IV SOLN: 250.0000 mL | INTRAVENOUS | Status: DC

## 2022-02-09 MED ORDER — POLYVINYL ALCOHOL 1.4 % OP SOLN
2.0000 [drp] | Freq: Every day | OPHTHALMIC | Status: DC
  Filled 2022-02-09: qty 15

## 2022-02-09 MED ORDER — LIDOCAINE 5 % EX PTCH
1.0000 | MEDICATED_PATCH | Freq: Every day | CUTANEOUS | Status: DC
  Filled 2022-02-09: qty 1

## 2022-02-09 MED ORDER — MIDAZOLAM HCL 2 MG/2ML IJ SOLN
INTRAMUSCULAR | Status: DC | PRN
  Administered 2022-02-09: 2 mg via INTRAVENOUS

## 2022-02-09 MED ORDER — IRBESARTAN 150 MG PO TABS
150.0000 mg | ORAL_TABLET | Freq: Every day | ORAL | Status: DC
  Administered 2022-02-10: 150 mg via ORAL
  Filled 2022-02-09: qty 1

## 2022-02-09 MED ORDER — ACETAMINOPHEN 160 MG/5ML PO SOLN: 1000.0000 mg | Freq: Once | ORAL | Status: DC | PRN

## 2022-02-09 MED ORDER — ONDANSETRON HCL 4 MG/2ML IJ SOLN
INTRAMUSCULAR | Status: AC
  Filled 2022-02-09: qty 2

## 2022-02-09 MED ORDER — PROPOFOL 10 MG/ML IV BOLUS
INTRAVENOUS | Status: AC
  Filled 2022-02-09: qty 20

## 2022-02-09 MED ORDER — SODIUM CHLORIDE 0.9% FLUSH
3.0000 mL | Freq: Two times a day (BID) | INTRAVENOUS | Status: DC
  Administered 2022-02-09 (×2): 3 mL via INTRAVENOUS

## 2022-02-09 MED ORDER — THROMBIN 5000 UNITS EX SOLR
CUTANEOUS | Status: AC
  Filled 2022-02-09: qty 5000

## 2022-02-09 MED ORDER — LIDOCAINE 2% (20 MG/ML) 5 ML SYRINGE
INTRAMUSCULAR | Status: AC
  Filled 2022-02-09: qty 5

## 2022-02-09 MED ORDER — LIDOCAINE-EPINEPHRINE 1 %-1:100000 IJ SOLN
INTRAMUSCULAR | Status: DC | PRN
  Administered 2022-02-09: 10 mL

## 2022-02-09 MED ORDER — NITROGLYCERIN 0.4 MG SL SUBL: 0.4000 mg | SUBLINGUAL_TABLET | SUBLINGUAL | Status: DC | PRN

## 2022-02-09 MED ORDER — 0.9 % SODIUM CHLORIDE (POUR BTL) OPTIME
TOPICAL | Status: DC | PRN
  Administered 2022-02-09: 1000 mL

## 2022-02-09 MED ORDER — PHENOL 1.4 % MT LIQD: 1.0000 | OROMUCOSAL | Status: DC | PRN

## 2022-02-09 MED ORDER — ONDANSETRON HCL 4 MG/2ML IJ SOLN
INTRAMUSCULAR | Status: DC | PRN
  Administered 2022-02-09: 4 mg via INTRAVENOUS

## 2022-02-09 MED ORDER — CHLORHEXIDINE GLUCONATE CLOTH 2 % EX PADS: 6.0000 | MEDICATED_PAD | Freq: Once | CUTANEOUS | Status: DC

## 2022-02-09 MED ORDER — THROMBIN 5000 UNITS EX SOLR: OROMUCOSAL | Status: DC | PRN

## 2022-02-09 MED ORDER — LIDOCAINE-EPINEPHRINE 1 %-1:100000 IJ SOLN
INTRAMUSCULAR | Status: AC
Start: 2022-02-09 — End: ?
  Filled 2022-02-09: qty 1

## 2022-02-09 MED ORDER — CHLORHEXIDINE GLUCONATE 0.12 % MT SOLN
15.0000 mL | Freq: Once | OROMUCOSAL | Status: AC
  Administered 2022-02-09: 15 mL via OROMUCOSAL
  Filled 2022-02-09: qty 15

## 2022-02-09 MED ORDER — ATORVASTATIN CALCIUM 10 MG PO TABS
10.0000 mg | ORAL_TABLET | Freq: Every day | ORAL | Status: DC
  Administered 2022-02-10: 10 mg via ORAL
  Filled 2022-02-09: qty 1

## 2022-02-09 MED ORDER — SUGAMMADEX SODIUM 200 MG/2ML IV SOLN
INTRAVENOUS | Status: DC | PRN
  Administered 2022-02-09: 200 mg via INTRAVENOUS

## 2022-02-09 MED ORDER — FENTANYL CITRATE (PF) 250 MCG/5ML IJ SOLN
INTRAMUSCULAR | Status: DC | PRN
  Administered 2022-02-09 (×2): 50 ug via INTRAVENOUS
  Administered 2022-02-09: 100 ug via INTRAVENOUS
  Administered 2022-02-09: 50 ug via INTRAVENOUS

## 2022-02-09 MED ORDER — MIDAZOLAM HCL 2 MG/2ML IJ SOLN
INTRAMUSCULAR | Status: AC
Start: 2022-02-09 — End: ?
  Filled 2022-02-09: qty 2

## 2022-02-09 MED ORDER — ACETAMINOPHEN 650 MG RE SUPP: 650.0000 mg | RECTAL | Status: DC | PRN

## 2022-02-09 MED ORDER — CELECOXIB 200 MG PO CAPS
200.0000 mg | ORAL_CAPSULE | Freq: Every day | ORAL | Status: DC
  Administered 2022-02-09 – 2022-02-10 (×2): 200 mg via ORAL
  Filled 2022-02-09 (×2): qty 1

## 2022-02-09 MED ORDER — PROPOFOL 10 MG/ML IV BOLUS
INTRAVENOUS | Status: AC
Start: 2022-02-09 — End: ?
  Filled 2022-02-09: qty 20

## 2022-02-09 MED ORDER — ONDANSETRON HCL 4 MG/2ML IJ SOLN: 4.0000 mg | Freq: Four times a day (QID) | INTRAMUSCULAR | Status: DC | PRN

## 2022-02-09 MED ORDER — FLUOROMETHOLONE 0.1 % OP OINT: 1.0000 | TOPICAL_OINTMENT | Freq: Four times a day (QID) | OPHTHALMIC | Status: DC | PRN

## 2022-02-09 MED ORDER — OXYCODONE HCL 5 MG/5ML PO SOLN: 5.0000 mg | Freq: Once | ORAL | Status: DC | PRN

## 2022-02-09 MED ORDER — MENTHOL 3 MG MT LOZG: 1.0000 | LOZENGE | OROMUCOSAL | Status: DC | PRN

## 2022-02-09 MED ORDER — DEXAMETHASONE SODIUM PHOSPHATE 10 MG/ML IJ SOLN
INTRAMUSCULAR | Status: AC
  Filled 2022-02-09: qty 1

## 2022-02-09 MED ORDER — LIDOCAINE 2% (20 MG/ML) 5 ML SYRINGE
INTRAMUSCULAR | Status: DC | PRN
  Administered 2022-02-09: 80 mg via INTRAVENOUS

## 2022-02-09 MED ORDER — ONDANSETRON HCL 4 MG PO TABS: 4.0000 mg | ORAL_TABLET | Freq: Four times a day (QID) | ORAL | Status: DC | PRN

## 2022-02-09 MED ORDER — OXYCODONE HCL 5 MG PO TABS: 5.0000 mg | ORAL_TABLET | ORAL | Status: DC | PRN

## 2022-02-09 MED ORDER — PHENYLEPHRINE HCL-NACL 20-0.9 MG/250ML-% IV SOLN
INTRAVENOUS | Status: DC | PRN
  Administered 2022-02-09: 25 ug/min via INTRAVENOUS

## 2022-02-09 SURGICAL SUPPLY — 55 items
BAG COUNTER SPONGE SURGICOUNT (BAG) ×3 IMPLANT
BAND RUBBER #18 3X1/16 STRL (MISCELLANEOUS) ×4 IMPLANT
BASKET BONE COLLECTION (BASKET) ×2 IMPLANT
BLADE SURG 11 STRL SS (BLADE) ×2 IMPLANT
BUR MATCHSTICK NEURO 3.0 LAGG (BURR) IMPLANT
BUR ROUND FLUTED 4 SOFT TCH (BURR) ×2 IMPLANT
BUR ROUND PRECISION 4.0 (BURR) IMPLANT
CAGE EXP CATALYFT 9 (Plate) ×1 IMPLANT
CNTNR URN SCR LID CUP LEK RST (MISCELLANEOUS) ×1 IMPLANT
CONT SPEC 4OZ STRL OR WHT (MISCELLANEOUS) ×1
COVER BACK TABLE 60X90IN (DRAPES) ×2 IMPLANT
DERMABOND ADVANCED (GAUZE/BANDAGES/DRESSINGS) ×1
DERMABOND ADVANCED .7 DNX12 (GAUZE/BANDAGES/DRESSINGS) ×1 IMPLANT
DRAPE C-ARM 42X72 X-RAY (DRAPES) ×2 IMPLANT
DRAPE C-ARMOR (DRAPES) ×2 IMPLANT
DRAPE LAPAROTOMY 100X72X124 (DRAPES) ×2 IMPLANT
DRAPE MICROSCOPE LEICA (MISCELLANEOUS) ×2 IMPLANT
ELECT BLADE 6.5 EXT (BLADE) ×2 IMPLANT
ELECT REM PT RETURN 9FT ADLT (ELECTROSURGICAL) ×2
ELECTRODE REM PT RTRN 9FT ADLT (ELECTROSURGICAL) ×1 IMPLANT
EXTENDER TAB GUIDE SV 5.5/6.0 (INSTRUMENTS) ×8 IMPLANT
GLOVE BIOGEL PI IND STRL 7.0 (GLOVE) IMPLANT
GLOVE BIOGEL PI IND STRL 7.5 (GLOVE) ×1 IMPLANT
GLOVE BIOGEL PI INDICATOR 7.0 (GLOVE) ×3
GLOVE BIOGEL PI INDICATOR 7.5 (GLOVE) ×3
GLOVE SURG SS PI 7.0 STRL IVOR (GLOVE) ×3 IMPLANT
GLOVE SURG SS PI 7.5 STRL IVOR (GLOVE) ×3 IMPLANT
GOWN STRL REUS W/ TWL LRG LVL3 (GOWN DISPOSABLE) ×1 IMPLANT
GOWN STRL REUS W/ TWL XL LVL3 (GOWN DISPOSABLE) IMPLANT
GOWN STRL REUS W/TWL 2XL LVL3 (GOWN DISPOSABLE) IMPLANT
GOWN STRL REUS W/TWL LRG LVL3 (GOWN DISPOSABLE) ×1
GOWN STRL REUS W/TWL XL LVL3 (GOWN DISPOSABLE) ×2
GUIDEWIRE BLUNT NT 450 (WIRE) ×4 IMPLANT
HEMOSTAT POWDER KIT SURGIFOAM (HEMOSTASIS) ×2 IMPLANT
KIT BASIN OR (CUSTOM PROCEDURE TRAY) ×2 IMPLANT
KIT INFUSE X SMALL 1.4CC (Orthopedic Implant) ×1 IMPLANT
KIT POSITION SURG JACKSON T1 (MISCELLANEOUS) ×2 IMPLANT
KIT TURNOVER KIT B (KITS) ×1 IMPLANT
NDL BEVEL TWO-PAK W/1PK (NEEDLE) IMPLANT
NDL SPNL 18GX3.5 QUINCKE PK (NEEDLE) IMPLANT
NEEDLE BEVEL TWO-PAK W/1PK (NEEDLE) ×2 IMPLANT
NEEDLE HYPO 22GX1.5 SAFETY (NEEDLE) ×2 IMPLANT
NEEDLE SPNL 18GX3.5 QUINCKE PK (NEEDLE) ×2 IMPLANT
NS IRRIG 1000ML POUR BTL (IV SOLUTION) ×2 IMPLANT
PACK LAMINECTOMY NEURO (CUSTOM PROCEDURE TRAY) ×2 IMPLANT
PAD ARMBOARD 7.5X6 YLW CONV (MISCELLANEOUS) ×4 IMPLANT
ROD 5.5 CCM PERC 40 (Rod) ×1 IMPLANT
ROD PERC CCM 5.5X35 (Rod) ×1 IMPLANT
SCREW MAS VOYAGER 6.5X40 (Screw) ×4 IMPLANT
SCREW SET 5.5/6.0MM SOLERA (Screw) ×4 IMPLANT
SUT MNCRL AB 3-0 PS2 18 (SUTURE) ×2 IMPLANT
SUT VIC AB 2-0 CP2 18 (SUTURE) ×2 IMPLANT
TOWEL GREEN STERILE (TOWEL DISPOSABLE) ×2 IMPLANT
TOWEL GREEN STERILE FF (TOWEL DISPOSABLE) ×2 IMPLANT
WATER STERILE IRR 1000ML POUR (IV SOLUTION) ×2 IMPLANT

## 2022-02-09 NOTE — Op Note (Signed)
PATIENT: Alexa Rodriguez  DAY OF SURGERY: 02/09/22   PRE-OPERATIVE DIAGNOSIS:  Lumbar spondylolisthesis, lumbar stenosis with neurogenic claudication   POST-OPERATIVE DIAGNOSIS:  Same   PROCEDURE:  L4-L5 minimally invasive laminectomy and transforaminal lumbar interbody fusion    SURGEON:  Surgeon(s) and Role:    Destiny Trickey, Joyice Faster, MD - Primary    Earnie Larsson, MD - Assisting   ANESTHESIA: ETGA   BRIEF HISTORY: This is a 66 year old woman who presented with right greater than left lower extremity pain and low back pain. The patient was found to have a mobile spondylolisthesis at L4-5 with severe canal stenosis. Her symptoms were refractory to non-surgical treatment measures, I therefore recommended surgical treatment in the form of an L4-5 MIS decompression and TLIF. This was discussed with the patient as well as risks, benefits, and alternatives and wished to proceed with surgery.   OPERATIVE DETAIL:  The patient was taken to the operating room and placed on the OR table in the prone position. A formal time out was performed with two patient identifiers and confirmed the operative site. Anesthesia was induced by the anesthesia team. The operative site was marked, hair was clipped with surgical clippers, the area was then prepped and draped in a sterile fashion.   Fluoroscopy was used to localize the surgical level. The pedicles were marked and used to create skin incisions bilaterally. With fluoro guidance, Jamshidi needles were used to guide K-wires into the bilateral L4 and L5 pedicles. The K wires were then secured with hemostats and attention turned to the TLIF.  A [employer] tube was then docked to the left L4-5 facet through the same incision using fluoroscopy. A left L4-5 facetectomy was performed and the left L4 nerve root was decompressed along its entire course.   The laminectomy was then performed. This was obviously more than what was needed for instrumentation alone. The tube  was wanded medially and the L4 lamina was removed bilaterally followed by the ligamentum flavum to its insertion superiorly, inferiorly, and its lateral extent bilaterally. Decompression was continued contralaterally until reaching the contralateral foramen with good decompression of the thecal sac as well as lateral recesses bilaterally using a combination of straight and curved Kerrison rongeurs. With laminectomy completed, the tube was wanded back to the disc space for completion of the TLIF.   The disc space was identified, incised, and a discectomy was performed in the standard fashion. The endplates were prepped, bone graft was packed into the disc space with the addition of rBMP, and an expandable cage ([employer]) was packed with autograft and placed into the disc space with fluoroscopic confirmation. The tube was removed and hemostasis was obtained during its removal.   Using the previously placed K wires, a tap and then screw with tower were placed bilaterally at L4 and L5. A rod was sized and introduced on both sides, confirmed with fluoroscopy, then final tightened. Hemostasis was again confirmed for both incisions, they were copiously irrigated, and then closed in layers.    EBL:  263m   DRAINS: none   SPECIMENS: none   TJudith Part MD 02/09/22 7:45 AM

## 2022-02-09 NOTE — Anesthesia Procedure Notes (Signed)
Procedure Name: Intubation Date/Time: 02/09/2022 7:57 AM  Performed by: Carolan Clines, CRNAPre-anesthesia Checklist: Patient identified, Emergency Drugs available, Suction available and Patient being monitored Patient Re-evaluated:Patient Re-evaluated prior to induction Oxygen Delivery Method: Circle System Utilized Preoxygenation: Pre-oxygenation with 100% oxygen Induction Type: IV induction Ventilation: Mask ventilation without difficulty and Oral airway inserted - appropriate to patient size Laryngoscope Size: Glidescope and 3 Grade View: Grade I Tube type: Oral Tube size: 7.0 mm Number of attempts: 1 Airway Equipment and Method: Oral airway, Rigid stylet and Video-laryngoscopy Placement Confirmation: ETT inserted through vocal cords under direct vision, positive ETCO2 and breath sounds checked- equal and bilateral Secured at: 22 cm Tube secured with: Tape Dental Injury: Teeth and Oropharynx as per pre-operative assessment  Comments: Elective glidescope intubation d/t limited mouth opening/limited ROM.

## 2022-02-09 NOTE — Transfer of Care (Signed)
Immediate Anesthesia Transfer of Care Note  Patient: Alexa Rodriguez  Procedure(s) Performed: LUMBAR FOUR-FIVE MINIMALLY INVASIVE (MIS) TRANSFORAMINAL LUMBAR INTERBODY FUSION (Spine Lumbar)  Patient Location: PACU  Anesthesia Type:General  Level of Consciousness: awake, alert  and oriented  Airway & Oxygen Therapy: Patient Spontanous Breathing  Post-op Assessment: Report given to RN and Post -op Vital signs reviewed and stable  Post vital signs: Reviewed and stable  Last Vitals:  Vitals Value Taken Time  BP 151/82 02/09/22 1109  Temp    Pulse 92 02/09/22 1111  Resp 23 02/09/22 1111  SpO2 94 % 02/09/22 1111  Vitals shown include unvalidated device data.  Last Pain:  Vitals:   02/09/22 0617  TempSrc:   PainSc: 5       Patients Stated Pain Goal: 2 (03/70/48 8891)  Complications: No notable events documented.

## 2022-02-09 NOTE — H&P (Signed)
Surgical H&P Update  HPI: 66 y.o. woman with a history of right greater than left lower extremity and low back pain. Workup showed multilevel disease of the lumbar spine, worst at L4-5 with canal stenosis and mobile spondylolisthesis, consistent with a likely cause of her symptoms. No changes in health since they were last seen. Still having the above and wishes to proceed with surgery.  PMHx:  Past Medical History:  Diagnosis Date   ADHD 01/30/2020   Anxiety    Depression 01/30/2020   Fibromyalgia 01/30/2020   Healthcare maintenance 08/11/2020   Hypertension    Insomnia 10/21/2020   Murmur 09/23/2020   OSA (obstructive sleep apnea) 01/30/2020   Pneumonia    Stable angina ([place]) 08/09/2020   FamHx: History reviewed. No pertinent family history. SocHx:  reports that she quit smoking about 12 years ago. Her smoking use included cigarettes. She has a 30.00 pack-year smoking history. She has never used smokeless tobacco. No history on file for alcohol use and drug use.  Physical Exam: Strength 5/5 x4 and SILTx4   Assesment/Plan: 66 y.o. woman with L4-5 mobile spondy and severe canal stenosis, here for L4-5 MIS decompression andTLIF. Risks, benefits, and alternatives discussed and the patient would like to continue with surgery.  -OR today -3C post-op  Judith Part, MD 02/09/22 7:42 AM

## 2022-02-10 DIAGNOSIS — I10 Essential (primary) hypertension: Secondary | ICD-10-CM | POA: Diagnosis not present

## 2022-02-10 DIAGNOSIS — Z87891 Personal history of nicotine dependence: Secondary | ICD-10-CM | POA: Diagnosis not present

## 2022-02-10 DIAGNOSIS — M48062 Spinal stenosis, lumbar region with neurogenic claudication: Secondary | ICD-10-CM | POA: Diagnosis not present

## 2022-02-10 DIAGNOSIS — M4316 Spondylolisthesis, lumbar region: Secondary | ICD-10-CM | POA: Diagnosis not present

## 2022-02-10 MED ORDER — OXYCODONE-ACETAMINOPHEN 5-325 MG PO TABS: 1.0000 | ORAL_TABLET | ORAL | 0 refills | Status: DC | PRN

## 2022-02-10 MED ORDER — METHOCARBAMOL 750 MG PO TABS: 750.0000 mg | ORAL_TABLET | Freq: Four times a day (QID) | ORAL | 2 refills | Status: AC | PRN

## 2022-02-10 NOTE — Evaluation (Signed)
Occupational Therapy Evaluation Patient Details Name: Bo Rogue MRN: [id number] DOB: [birth year] Today's Date: 02/10/2022   History of Present Illness 66 yo female s/p L4-5 TLIF. PMH including ADHD, Anxiety, Depression, Fibromyalgia, HTN, and R TKA   Clinical Impression   PTA, pt was living with her husband and was independent for ADLs and using SPC for mobility. Currently, pt performing ADLs and functional mobility using RW at Supervision level. Provided education on back precautions, bed mobility, grooming, LB ADLs, toileting, and shower transfer with shower seat; pt demonstrated understanding. Answered all pt questions. Recommend dc home once medically stable per physician. All acute OT needs met and will sign off. Thank you.      Recommendations for follow up therapy are one component of a multi-disciplinary discharge planning process, led by the attending physician.  Recommendations may be updated based on patient status, additional functional criteria and insurance authorization.   Follow Up Recommendations  No OT follow up    Assistance Recommended at Discharge Frequent or constant Supervision/Assistance  Patient can return home with the following A little help with walking and/or transfers;A little help with bathing/dressing/bathroom    Functional Status Assessment  Patient has had a recent decline in their functional status and demonstrates the ability to make significant improvements in function in a reasonable and predictable amount of time.  Equipment Recommendations  Other (comment) (RW)    Recommendations for Other Services       Precautions / Restrictions Precautions Precautions: Back Precaution Comments: Reviewing back precautions and compensatory techniques Required Braces or Orthoses: Other Brace Other Brace: No brace per MD Restrictions Weight Bearing Restrictions: No      Mobility Bed Mobility Overal bed mobility: Modified Independent              General bed mobility comments: log roll    Transfers Overall transfer level: Needs assistance Equipment used: Rolling walker (2 wheels) Transfers: Sit to/from Stand Sit to Stand: Supervision                  Balance Overall balance assessment: Needs assistance Sitting-balance support: No upper extremity supported, Feet supported Sitting balance-Leahy Scale: Good     Standing balance support: No upper extremity supported, During functional activity Standing balance-Leahy Scale: Fair Standing balance comment: Able to pull pant over hips without UE support for balance                           ADL either performed or assessed with clinical judgement   ADL Overall ADL's : Needs assistance/impaired                                       General ADL Comments: Providing education on compensatory techniques for bed mobility, UB ADLs, LB ADLs, grooming, toileting, and shower transfer. Pt performign at Supervision level     Vision         Perception     Praxis      Pertinent Vitals/Pain Pain Assessment Pain Assessment: Faces Faces Pain Scale: Hurts even more Pain Location: back Pain Descriptors / Indicators: Discomfort, Grimacing, Guarding Pain Intervention(s): Monitored during session, Limited activity within patient's tolerance, Repositioned     Hand Dominance Right   Extremity/Trunk Assessment Upper Extremity Assessment Upper Extremity Assessment: Overall WFL for tasks assessed   Lower Extremity Assessment Lower Extremity Assessment: Defer to PT evaluation  Cervical / Trunk Assessment Cervical / Trunk Assessment: Back Surgery   Communication Communication Communication: No difficulties   Cognition Arousal/Alertness: Awake/alert Behavior During Therapy: WFL for tasks assessed/performed Overall Cognitive Status: Within Functional Limits for tasks assessed                                       General  Comments  Husband present    Exercises     Shoulder Instructions      Home Living Family/patient expects to be discharged to:: Private residence Living Arrangements: Spouse/significant other Available Help at Discharge: Family;Available 24 hours/day Type of Home: House Home Access: Stairs to enter [employer] of Steps: 1 front; 3 back Entrance Stairs-Rails: Left (back) Home Layout: One level     Bathroom Shower/Tub: Occupational psychologist: Standard     Home Equipment: Cane - single point          Prior Functioning/Environment Prior Level of Function : Independent/Modified Independent             Mobility Comments: Using a cane ADLs [place]        OT Problem List: Decreased strength;Decreased range of motion;Decreased activity tolerance;Impaired balance (sitting and/or standing);Decreased knowledge of use of DME or AE;Decreased knowledge of precautions      OT Treatment/Interventions:      OT Goals(Current goals can be found in the care plan section) Acute Rehab OT Goals Patient Stated Goal: Go home OT Goal Formulation: All assessment and education complete, DC therapy  OT Frequency:      Co-evaluation              AM-PAC OT "6 Clicks" Daily Activity     Outcome Measure Help from another person eating meals?: None Help from another person taking care of personal grooming?: A Little Help from another person toileting, which includes using toliet, bedpan, or urinal?: A Little Help from another person bathing (including washing, rinsing, drying)?: A Little Help from another person to put on and taking off regular upper body clothing?: None Help from another person to put on and taking off regular lower body clothing?: A Little 6 Click Score: 20   End of Session Equipment Utilized During Treatment: Rolling walker (2 wheels) Nurse Communication: Mobility status  Activity Tolerance: Patient tolerated treatment  well Patient left: Other (comment) (in hallway with PT)  OT Visit Diagnosis: Unsteadiness on feet (R26.81);Other abnormalities of gait and mobility (R26.89);Muscle weakness (generalized) (M62.81)                Time: 2694-8546 OT Time Calculation (min): 17 min Charges:  OT General Charges $OT Visit: 1 Visit OT Evaluation $OT Eval Low Complexity: 1 Low  Clare Fennimore MSOT, OTR/L Acute Rehab Office: [place] 02/10/2022, 9:05 AM

## 2022-02-10 NOTE — Plan of Care (Signed)
Pt and husband given D/C instructions with verbal understanding. Rx's were sent to the pharmacy by MD. Pt's incision is clean and dry with no sign of infection. Pt's IV was removed prior to D/C. Pt received RW from [employer] per MD order. Pt D/C'd home via wheelchair per MD order. Pt is stable @ D/C and has no other needs at this time. Holli Humbles, RN

## 2022-02-10 NOTE — Care Management Obs Status (Signed)
[place] NOTIFICATION   Patient Details  Name: Alexa Rodriguez MRN: [id number] Date of Birth: 12-27-1955   Medicare Observation Status Notification Given:  Yes    Pollie Friar, RN 02/10/2022, 8:08 AM

## 2022-02-10 NOTE — Evaluation (Signed)
Physical Therapy Evaluation Patient Details Name: Alexa Rodriguez MRN: [id number] DOB: Sep 10, 1955 Today's Date: 02/10/2022  History of Present Illness  66 yo female s/p L4-5 TLIF. PMH including ADHD, Anxiety, Depression, Fibromyalgia, HTN, and R TKA  Clinical Impression  PTA pt living with husband in single story home with one step to enter. Prior to surgery pt limited by low back pain but able to ambulate and perform ADLs/iADLs independently. Pt currently at a mod I-supervision level for ambulation and transfers and min guard for stairs. Pt able to recall 2/3 back precautions taught by PT, back precautions reviewed. Pt has no additional PT needs at discharge and has verbalized and demonstrated understanding about movement after back surgery. Pt ready for discharge home.    Recommendations for follow up therapy are one component of a multi-disciplinary discharge planning process, led by the attending physician.  Recommendations may be updated based on patient status, additional functional criteria and insurance authorization.  Follow Up Recommendations No PT follow up    Assistance Recommended at Discharge Intermittent Supervision/Assistance  Patient can return home with the following  Help with stairs or ramp for entrance;Assistance with cooking/housework;A little help with bathing/dressing/bathroom    Equipment Recommendations Rolling walker (2 wheels)     Functional Status Assessment Patient has had a recent decline in their functional status and demonstrates the ability to make significant improvements in function in a reasonable and predictable amount of time.     Precautions / Restrictions Precautions Precautions: Back Precaution Comments: pt able to recall 2/3 back precautions, reviewed back precautions Required Braces or Orthoses: Other Brace Other Brace: No brace per MD Restrictions Weight Bearing Restrictions: No      Mobility  Bed Mobility Overal bed mobility:  Modified Independent             General bed mobility comments: good use of log roll to return to bed    Transfers Overall transfer level: Needs assistance Equipment used: Rolling walker (2 wheels) Transfers: Sit to/from Stand Sit to Stand: Supervision           General transfer comment: supervision for safety    Ambulation/Gait Ambulation/Gait assistance: Supervision Gait Distance (Feet): 100 Feet Assistive device: Rolling walker (2 wheels), Rollator (4 wheels) Gait Pattern/deviations: Step-through pattern, Decreased step length - right, Decreased step length - left, WFL(Within Functional Limits) Gait velocity: slowed Gait velocity interpretation: <1.8 ft/sec, indicate of risk for recurrent falls   General Gait Details: supervision for safety, with slowed, steady gait, cues for not twisting torso when turning  Stairs Stairs: Yes Stairs assistance: Min guard Stair Management: Backwards, Forwards Number of Stairs: 1 General stair comments: pt able to step up one step x3 good power up and self steady with RW over single step        Balance Overall balance assessment: Needs assistance Sitting-balance support: No upper extremity supported, Feet supported Sitting balance-Leahy Scale: Good     Standing balance support: No upper extremity supported, During functional activity Standing balance-Leahy Scale: Fair                               Pertinent Vitals/Pain Pain Assessment Pain Assessment: Faces Faces Pain Scale: Hurts even more Pain Location: back Pain Descriptors / Indicators: Discomfort, Grimacing, Guarding Pain Intervention(s): Limited activity within patient's tolerance, Monitored during session, Premedicated before session    [place] expects to be discharged to:: Private residence Living Arrangements: Spouse/significant other Available Help  at Discharge: Family;Available 24 hours/day Type of Home: House Home Access:  Stairs to enter Entrance Stairs-Rails: Left (back) Entrance Stairs-Number of Steps: 1 front; 3 back   Home Layout: One level Home Equipment: Cane - single point      Prior Function Prior Level of Function : Independent/Modified Independent             Mobility Comments: Using a cane ADLs Comments[place]     Hand Dominance   Dominant Hand: Right    Extremity/Trunk Assessment   Upper Extremity Assessment Upper Extremity Assessment: Defer to OT evaluation    Lower Extremity Assessment Lower Extremity Assessment: Overall WFL for tasks assessed    Cervical / Trunk Assessment Cervical / Trunk Assessment: Back Surgery  Communication   Communication: No difficulties  Cognition Arousal/Alertness: Awake/alert Behavior During Therapy: WFL for tasks assessed/performed Overall Cognitive Status: Within Functional Limits for tasks assessed                                          General Comments General comments (skin integrity, edema, etc.): husband present, discussed up coming convention, advised to inquire with MD at follow up visit        Assessment/Plan    PT Assessment Patient does not need any further PT services         PT Goals (Current goals can be found in the Care Plan section)  Acute Rehab PT Goals Patient Stated Goal: be able to go to conference PT Goal Formulation: With patient/family Time For Goal Achievement: 02/24/22 Potential to Achieve Goals: Good     AM-PAC PT "6 Clicks" Mobility  Outcome Measure Help needed turning from your back to your side while in a flat bed without using bedrails?: None Help needed moving from lying on your back to sitting on the side of a flat bed without using bedrails?: None Help needed moving to and from a bed to a chair (including a wheelchair)?: None Help needed standing up from a chair using your arms (e.g., wheelchair or bedside chair)?: None Help needed to walk in hospital room?:  None Help needed climbing 3-5 steps with a railing? : A Little 6 Click Score: 23    End of Session   Activity Tolerance: Patient tolerated treatment well;Patient limited by pain Patient left: in bed;with call bell/phone within reach;with family/visitor present Nurse Communication: Mobility status;Other (comment) (ready for discharge) PT Visit Diagnosis: Other abnormalities of gait and mobility (R26.89);Muscle weakness (generalized) (M62.81);Pain Pain - part of body:  (low back)    Time: 0086-7619 PT Time Calculation (min) (ACUTE ONLY): 25 min   Charges:   PT Evaluation $PT Eval Low Complexity: 1 Low PT Treatments $Gait Training: 8-22 mins        Alyzza Andringa B. Migdalia Dk PT, DPT Acute Rehabilitation Services Please use secure chat or  Call Office [phone]   [place] 02/10/2022, 11:43 AM

## 2022-02-10 NOTE — Progress Notes (Signed)
Neurosurgery Service Progress Note  Subjective: No acute events overnight, radicular pain completely gone - she's very happy, but back is quite sore   Objective: Vitals:   02/09/22 2038 02/09/22 2338 02/10/22 0505 02/10/22 0815  BP: (!) 133/58 132/67 (!) 116/59 (!) 98/58  Pulse: 67 69 70 84  Resp: '18  18 16  '$ Temp: 97.8 F (36.6 C) 98.2 F (36.8 C) 98.2 F (36.8 C) 98.2 F (36.8 C)  TempSrc: Oral Oral Oral Oral  SpO2: 99% 98% 95% 95%  Weight:      Height:        Physical Exam: Strength 5/5 x4 and SILTx4   Assessment & Plan: 66 y.o. woman s/p MIS L4-5 TLIF, recovering well.  -discharge home today after working with PT/OT  Judith Part  02/10/22 8:40 AM

## 2022-02-10 NOTE — Care Management CC44 (Signed)
Condition Code 44 Documentation Completed  Patient Details  Name: Akeyla Molden MRN: [id number] Date of Birth: 26-Sep-1955   Condition Code 44 given:  Yes Patient signature on Condition Code 44 notice:  Yes Documentation of 2 MD's agreement:  Yes Code 44 added to claim:  Yes    Pollie Friar, RN 02/10/2022, 8:08 AM

## 2022-02-10 NOTE — Discharge Summary (Signed)
Discharge Summary  Date of Admission: 02/09/2022  Date of Discharge: 02/10/22  Attending Physician: Emelda Brothers, MD  Hospital Course: Patient was admitted following an uncomplicated Q2-0 MIS TLIF. They were recovered in PACU and transferred to [facility]. Their preop symptoms were completely resolved, their hospital course was uncomplicated and the patient was discharged home on 02/10/22. They will follow up in clinic with me in clinic in 2 weeks.  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

## 2022-02-11 ENCOUNTER — Encounter (HOSPITAL_COMMUNITY): Payer: Self-pay | Admitting: Neurological Surgery

## 2022-02-11 NOTE — Anesthesia Postprocedure Evaluation (Signed)
Anesthesia Post Note  Patient: Alexa Rodriguez  Procedure(s) Performed: LUMBAR FOUR-FIVE MINIMALLY INVASIVE (MIS) TRANSFORAMINAL LUMBAR INTERBODY FUSION (Spine Lumbar)     Patient location during evaluation: PACU Anesthesia Type: General Level of consciousness: awake and alert Pain management: pain level controlled Vital Signs Assessment: post-procedure vital signs reviewed and stable Respiratory status: spontaneous breathing, nonlabored ventilation, respiratory function stable and patient connected to nasal cannula oxygen Cardiovascular status: blood pressure returned to baseline and stable Postop Assessment: no apparent nausea or vomiting Anesthetic complications: no   No notable events documented.  Last Vitals:  Vitals:   02/10/22 0505 02/10/22 0815  BP: (!) 116/59 (!) 98/58  Pulse: 70 84  Resp: 18 16  Temp: 36.8 C 36.8 C  SpO2: 95% 95%    Last Pain:  Vitals:   02/10/22 0935  TempSrc:   PainSc: 7                  Gabriell Daigneault

## 2022-02-25 ENCOUNTER — Other Ambulatory Visit: Payer: Self-pay | Admitting: Student

## 2022-02-25 ENCOUNTER — Encounter: Payer: Self-pay | Admitting: Physician Assistant

## 2022-02-25 DIAGNOSIS — Z1231 Encounter for screening mammogram for malignant neoplasm of breast: Secondary | ICD-10-CM

## 2022-03-10 ENCOUNTER — Inpatient Hospital Stay: Admission: RE | Admit: 2022-03-10 | Payer: Medicare HMO | Source: Ambulatory Visit

## 2022-03-23 NOTE — Progress Notes (Deleted)
03/23/2022 Alexa Rodriguez Marc Morgans [id number] 1956/06/10  Referring provider: Charise Killian, MD Primary GI doctor: {acdocs:[id number]}  ASSESSMENT AND PLAN:   There are no diagnoses linked to this encounter.   Patient Care Team: Virl Axe, MD as PCP - General (Student) Berniece Salines, DO as PCP - Cardiology (Cardiology)  HISTORY OF PRESENT ILLNESS: 66 y.o. female with a past medical history of ADHD, fibromyalgia, depression, OSA, nonobstructing coronary artery disease coronary calcium score of 1 in 2022, echo showed grade 1 diastolic dysfunction, stable angina and others listed below presents for evaluation of ***.    Current Medications:    Current Outpatient Medications (Cardiovascular):    atorvastatin (LIPITOR) 10 MG tablet, Take 10 mg by mouth daily.   nitroGLYCERIN (NITROSTAT) 0.4 MG SL tablet, Place 1 tablet (0.4 mg total) under the tongue every 5 (five) minutes as needed for chest pain.   olmesartan (BENICAR) 20 MG tablet, Take 10 mg by mouth daily.   Current Outpatient Medications (Analgesics):    celecoxib (CELEBREX) 200 MG capsule, Take 200 mg by mouth daily.   oxyCODONE-acetaminophen (PERCOCET) 5-325 MG tablet, Take 1 tablet by mouth every 4 (four) hours as needed (pain).   Current Outpatient Medications (Other):    carboxymethylcellulose (REFRESH PLUS) 0.5 % SOLN, Place 1 drop into both eyes daily.   COLLAGEN PO, Take 2 capsules by mouth daily.   DULoxetine (CYMBALTA) 60 MG capsule, Take 1 capsule by mouth once daily   fluorometholone (FML) 0.1 % ophthalmic ointment, 1 application. 4 (four) times daily as needed (dry/itchy eyes).   lidocaine (LIDODERM) 5 %, Place 1 patch onto the skin every 12 (twelve) hours. Remove & Discard patch within 12 hours or as directed by MD (Patient taking differently: Place 1 patch onto the skin every 12 (twelve) hours as needed (pain). Remove & Discard patch within 12 hours or as directed by MD)   methocarbamol (ROBAXIN) 750 MG tablet,  Take 1 tablet (750 mg total) by mouth every 6 (six) hours as needed for muscle spasms.   Multiple Vitamins-Minerals (MULTIVITAMIN WITH MINERALS) tablet, Take 1 tablet by mouth daily.   OVER THE COUNTER MEDICATION, Take 12 mg by mouth at bedtime. CBD 12 mg XITE with Delta 9 (9 mg)   OVER THE COUNTER MEDICATION, 1 Package daily. Activate C - immune complex   pregabalin (LYRICA) 50 MG capsule, Take 50 mg by mouth 2 (two) times daily.   Vitamin D-Vitamin K (K2 PLUS D3 PO), Take 1 tablet by mouth daily.  Medical History:  Past Medical History:  Diagnosis Date   ADHD 01/30/2020   Anxiety    Depression 01/30/2020   Fibromyalgia 01/30/2020   Healthcare maintenance 08/11/2020   Hypertension    Insomnia 10/21/2020   Murmur 09/23/2020   OSA (obstructive sleep apnea) 01/30/2020   Pneumonia    Stable angina ([place]) 08/09/2020   Allergies:  Allergies  Allergen Reactions   Lopressor [Metoprolol Tartrate] Other (See Comments)    Heart Attacks    Cortisone Hypertension    Elevates B/P    Codeine Nausea And Vomiting   Latex Rash   Neurontin [Gabapentin] Anxiety and Other (See Comments)    Made pt feel angry, and caused anxiety      Surgical History:  She  has a past surgical history that includes Breast lumpectomy (Left, 1990); Tonsillectomy; Joint replacement; Cardiac catheterization; and Transforaminal lumbar interbody fusion w/ mis 1 level (N/A, 02/09/2022). Family History:  Her family history is not on file. Social History:  reports that she quit smoking about 12 years ago. Her smoking use included cigarettes. She has a 30.00 pack-year smoking history. She has never used smokeless tobacco. No history on file for alcohol use and drug use.  REVIEW OF SYSTEMS  : All other systems reviewed and negative except where noted in the History of Present Illness.   PHYSICAL EXAM: There were no vitals taken for this visit. General:   Pleasant, well developed female in no acute distress Head:    Normocephalic and atraumatic. Eyes:  {sclerae:26738},conjunctive {conjuctiva:26739}  Heart:   {HEART EXAM HEM/ONC:21750} Pulm:  Clear anteriorly; no wheezing Abdomen:   {BlankSingle:19197::"Distended","Ridged","Soft"}, {BlankSingle:19197::"Flat","Obese","Non-distended"} AB, {BlankSingle:19197::"Absent","Hyperactive, tinkling","Hypoactive","Sluggish","Active"} bowel sounds. {actendernessAB:27319} tenderness {anatomy; site abdomen:5010}. {BlankMultiple:19196::"Without guarding","With guarding","Without rebound","With rebound"}, No organomegaly appreciated. Rectal: {acrectalexam:27461} Extremities:  {With/Without:304960234} edema. Msk: Symmetrical without gross deformities. Peripheral pulses intact.  Neurologic:  Alert and  oriented x4;  No focal deficits.  Skin:   Dry and intact without significant lesions or rashes. Psychiatric:  Cooperative. Normal mood and affect.    Vladimir Crofts, PA-C 4:31 PM

## 2022-03-24 ENCOUNTER — Ambulatory Visit: Payer: Medicare HMO | Admitting: Physician Assistant

## 2022-04-20 NOTE — Progress Notes (Unsigned)
04/22/2022 Alexa Rodriguez Marc Morgans [id number] 12/10/1955  Referring provider: Virl Axe, MD Primary GI doctor: Dr. Lyndel Safe  ASSESSMENT AND PLAN:   Diarrhea, unspecified type - - stool samples to rule out infection with ABX use and drinking unpasteurized goat milk that correlate with symptoms-  -CRP/ESR to rule out inflammation/IBD  -Pancreatic elastase due to worse after food  -TTG/IGA to evaluate for celiac disease.  - Discussed FODMAP diet, avoid lactose.  -will schedule for colonoscopy at this time as patient is due and to evaluate for microscopic colitis with nocturnal symptoms. -discuss stopping/switching olmesartan with PCP -     CBC with Differential/Platelet; Future -     Comprehensive metabolic panel; Future -     TSH; Future -     IgA; Future -     Tissue transglutaminase, IgA; Future -     Sedimentation rate; Future -     C-reactive protein; Future -     Pancreatic elastase, fecal; Future -     Fecal fat, qualitative; Future -     Cancel: Clostridium difficile Toxin B, Qualitative, Real-Time PCR; Future -     GI Profile, Stool, PCR; Future  Minimal CAD 2022 Coronary artery score of 1, minimal plaque mid LCX.  Obesity (BMI 30-39.9) Body mass index is 35.16 kg/m.  -Patient has been advised to make an attempt to improve diet and exercise patterns to aid in weight loss. -Recommended diet heavy in fruits and veggies and low in animal meats, cheeses, and dairy products, appropriate calorie intake     Patient Care Team: Virl Axe, MD as PCP - General (Student) Berniece Salines, DO as PCP - Cardiology (Cardiology)  HISTORY OF PRESENT ILLNESS: 66 y.o. female with a past medical history of hypertension, OSA, prediabetes, grade 1 diastolic dysfunction, minimal coronary disease and others listed below presents for evaluation of diarrhea.  2022 patient was having pericardial chest pain shortness of breath, saw Dr. Godfrey Pick Tobb. Coronary artery score of 1, minimal  plaque mid LCX.  Last colonoscopy was in [place] about 12 + years ago per patient, states was normal, recall was every 10 years. No family history of colon cancer.  She has had diarrhea x 1 years, intermittent, was every day for 6 months, would have 6 or more times a day.  Worse with eating anything.  She continues to have loose stools, occ hard stools, explosive stools.  She has AB cramping with BM, better afterwards.  Have urgency with stools, has had rectal incontinence, has nocturnal symptoms.  She had lost 11 lbs but has gained 5 lbs back.  She has some AB bloating.  Denies fever, chills.  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  Denies GERD, nausea, vomiting.   She has been on olmesartan x 2 years.  She had back surgery in June and knee in Nov, was on oxycodone for that but has not had any since that time.  She was switched from voltern to celebrex about 6 months ago.    She  reports that she quit smoking about 12 years ago. Her smoking use included cigarettes. She has a 30.00 pack-year smoking history. She has never used smokeless tobacco. She reports current alcohol use of about 2.0 standard drinks of alcohol per week. No history on file for drug use.  Current Medications:    Current Outpatient Medications (Cardiovascular):    atorvastatin (LIPITOR) 10 MG tablet, Take 10 mg by mouth daily.  nitroGLYCERIN (NITROSTAT) 0.4 MG SL tablet, Place 1 tablet (0.4 mg total) under the tongue every 5 (five) minutes as needed for chest pain.   olmesartan (BENICAR) 20 MG tablet, Take 10 mg by mouth daily.   Current Outpatient Medications (Analgesics):    celecoxib (CELEBREX) 200 MG capsule, Take 200 mg by mouth daily.   oxyCODONE-acetaminophen (PERCOCET) 5-325 MG tablet, Take 1 tablet by mouth every 4 (four) hours as needed (pain). (Patient not taking: Reported on 04/22/2022)   Current Outpatient Medications (Other):    carboxymethylcellulose  (REFRESH PLUS) 0.5 % SOLN, Place 1 drop into both eyes daily.   COLLAGEN PO, Take 2 capsules by mouth daily.   DULoxetine (CYMBALTA) 60 MG capsule, Take 1 capsule by mouth once daily   lidocaine (LIDODERM) 5 %, Place 1 patch onto the skin every 12 (twelve) hours. Remove & Discard patch within 12 hours or as directed by MD   methocarbamol (ROBAXIN) 750 MG tablet, Take 1 tablet (750 mg total) by mouth every 6 (six) hours as needed for muscle spasms.   OVER THE COUNTER MEDICATION, Take 12 mg by mouth at bedtime. CBD 12 mg XITE with Delta 9 (9 mg)   OVER THE COUNTER MEDICATION, 1 Package daily. Activate C - immune complex   pregabalin (LYRICA) 50 MG capsule, Take 50 mg by mouth 2 (two) times daily.   Vitamin D-Vitamin K (K2 PLUS D3 PO), Take 1 tablet by mouth daily.   fluorometholone (FML) 0.1 % ophthalmic ointment, 1 application. 4 (four) times daily as needed (dry/itchy eyes). (Patient not taking: Reported on 04/22/2022)   Multiple Vitamins-Minerals (MULTIVITAMIN WITH MINERALS) tablet, Take 1 tablet by mouth daily. (Patient not taking: Reported on 04/22/2022)  Medical History:  Past Medical History:  Diagnosis Date   ADHD 01/30/2020   Anxiety    Arthritis    Depression 01/30/2020   Elevated cholesterol    Fibromyalgia 01/30/2020   Healthcare maintenance 08/11/2020   Hypertension    Insomnia 10/21/2020   Murmur 09/23/2020   OSA (obstructive sleep apnea) 01/30/2020   Pneumonia    Stable angina ([place]) 08/09/2020   Allergies:  Allergies  Allergen Reactions   Lopressor [Metoprolol Tartrate] Other (See Comments)    Heart Attacks    Cortisone Hypertension    Elevates B/P    Codeine Nausea And Vomiting   Latex Rash   Neurontin [Gabapentin] Anxiety and Other (See Comments)    Made pt feel angry, and caused anxiety      Surgical History:  She  has a past surgical history that includes Breast lumpectomy (Left, 1990); Tonsillectomy; Joint replacement; Cardiac catheterization; Transforaminal  lumbar interbody fusion w/ mis 1 level (N/A, 02/09/2022); and Vaginal hysterectomy. Family History:  Her family history includes Atrial fibrillation in her mother; Clotting disorder in her mother; Diabetes in her maternal grandmother; Heart disease in her father, mother, paternal grandfather, and paternal grandmother; Kidney disease in her maternal grandmother.  REVIEW OF SYSTEMS  : All other systems reviewed and negative except where noted in the History of Present Illness.  PHYSICAL EXAM: BP 130/76   Pulse 65   Ht 5' 2"  (1.575 m)   Wt 192 lb 4 oz (87.2 kg)   BMI 35.16 kg/m  General:   Pleasant, well developed female in no acute distress Head:   Normocephalic and atraumatic. Eyes:  sclerae anicteric,conjunctive pink  Heart:   regular rate and rhythm Pulm:  Clear anteriorly; no wheezing Abdomen:   Soft, Obese AB, Active bowel sounds. mild tenderness  in the lower abdomen. Without guarding and Without rebound, No organomegaly appreciated. Rectal: Not evaluated Extremities:  Without edema. Msk: Symmetrical without gross deformities. Peripheral pulses intact.  Neurologic:  Alert and  oriented x4;  No focal deficits.Pain with change in positions.  Skin:   Dry and intact without significant lesions or rashes. Psychiatric:  Cooperative. Normal mood and affect.    Vladimir Crofts, PA-C 11:38 AM

## 2022-04-22 ENCOUNTER — Other Ambulatory Visit (INDEPENDENT_AMBULATORY_CARE_PROVIDER_SITE_OTHER): Payer: Federal, State, Local not specified - PPO

## 2022-04-22 ENCOUNTER — Encounter: Payer: Self-pay | Admitting: Physician Assistant

## 2022-04-22 ENCOUNTER — Other Ambulatory Visit: Payer: Self-pay

## 2022-04-22 ENCOUNTER — Ambulatory Visit (INDEPENDENT_AMBULATORY_CARE_PROVIDER_SITE_OTHER): Payer: Federal, State, Local not specified - PPO | Admitting: Physician Assistant

## 2022-04-22 DIAGNOSIS — I2510 Atherosclerotic heart disease of native coronary artery without angina pectoris: Secondary | ICD-10-CM

## 2022-04-22 DIAGNOSIS — D649 Anemia, unspecified: Secondary | ICD-10-CM | POA: Diagnosis not present

## 2022-04-22 DIAGNOSIS — R197 Diarrhea, unspecified: Secondary | ICD-10-CM

## 2022-04-22 DIAGNOSIS — E669 Obesity, unspecified: Secondary | ICD-10-CM

## 2022-04-22 LAB — TSH: TSH: 0.6 u[IU]/mL (ref 0.35–5.50)

## 2022-04-22 LAB — CBC WITH DIFFERENTIAL/PLATELET
Basophils Absolute: 0 10*3/uL (ref 0.0–0.1)
Basophils Relative: 0.6 % (ref 0.0–3.0)
Eosinophils Absolute: 0.1 10*3/uL (ref 0.0–0.7)
Eosinophils Relative: 2.2 % (ref 0.0–5.0)
HCT: 31.9 % — ABNORMAL LOW (ref 36.0–46.0)
Hemoglobin: 10.7 g/dL — ABNORMAL LOW (ref 12.0–15.0)
Lymphocytes Relative: 26.8 % (ref 12.0–46.0)
Lymphs Abs: 1.1 10*3/uL (ref 0.7–4.0)
MCHC: 33.6 g/dL (ref 30.0–36.0)
MCV: 89.2 fl (ref 78.0–100.0)
Monocytes Absolute: 0.3 10*3/uL (ref 0.1–1.0)
Monocytes Relative: 8.3 % (ref 3.0–12.0)
Neutro Abs: 2.6 10*3/uL (ref 1.4–7.7)
Neutrophils Relative %: 62.1 % (ref 43.0–77.0)
Platelets: 214 10*3/uL (ref 150.0–400.0)
RBC: 3.57 Mil/uL — ABNORMAL LOW (ref 3.87–5.11)
RDW: 14.7 % (ref 11.5–15.5)
WBC: 4.1 10*3/uL (ref 4.0–10.5)

## 2022-04-22 LAB — COMPREHENSIVE METABOLIC PANEL
ALT: 14 U/L (ref 0–35)
AST: 17 U/L (ref 0–37)
Albumin: 4 g/dL (ref 3.5–5.2)
Alkaline Phosphatase: 79 U/L (ref 39–117)
BUN: 24 mg/dL — ABNORMAL HIGH (ref 6–23)
CO2: 23 mEq/L (ref 19–32)
Calcium: 8.8 mg/dL (ref 8.4–10.5)
Chloride: 111 mEq/L (ref 96–112)
Creatinine, Ser: 0.63 mg/dL (ref 0.40–1.20)
GFR: 92.78 mL/min (ref >60.00)
Glucose, Bld: 92 mg/dL (ref 70–99)
Potassium: 4.3 mEq/L (ref 3.5–5.1)
Sodium: 142 mEq/L (ref 135–145)
Total Bilirubin: 0.5 mg/dL (ref 0.2–1.2)
Total Protein: 6.4 g/dL (ref 6.0–8.3)

## 2022-04-22 LAB — SEDIMENTATION RATE: Sed Rate: 6 mm/hr (ref 0–30)

## 2022-04-22 LAB — C-REACTIVE PROTEIN: CRP: <1 mg/dL (ref 0.5–20.0)

## 2022-04-22 LAB — VITAMIN B12: Vitamin B-12: 163 pg/mL — ABNORMAL LOW (ref 211–911)

## 2022-04-22 MED ORDER — CLENPIQ 10-3.5-12 MG-GM -GM/160ML PO SOLN: 1.0000 | ORAL | 0 refills | Status: DC

## 2022-04-22 NOTE — Patient Instructions (Addendum)
_______________________________________________________  If you are age 66 or older, your body mass index should be between 23-30. Your Body mass index is 35.16 kg/m. If this is out of the aforementioned range listed, please consider follow up with your Primary Care Provider. ________________________________________________________  The [facility] providers would like to encourage you to use [facility] to communicate with providers for non-urgent requests or questions.  Due to long hold times on the telephone, sending your provider a message by [facility] may be a faster and more efficient way to get a response.  Please allow 48 business hours for a response.  Please remember that this is for non-urgent requests.  _______________________________________________________   Your provider has requested that you go to the basement level for lab work before leaving today. Press "B" on the elevator. The lab is located at the first door on the left as you exit the elevator.  You have been scheduled for a colonoscopy. Please follow written instructions given to you at your visit today.  Please pick up your prep supplies at the pharmacy within the next 1-3 days. If you use inhalers (even only as needed), please bring them with you on the day of your procedure.  Due to recent changes in healthcare laws, you may see the results of your imaging and laboratory studies on MyChart before your provider has had a chance to review them.  We understand that in some cases there may be results that are confusing or concerning to you. Not all laboratory results come back in the same time frame and the provider may be waiting for multiple results in order to interpret others.  Please give [place] 48 hours in order for your provider to thoroughly review all the results before contacting the office for clarification of your results.    DISCUSS WITH PRIMARY CARE ABOUT SWITCHING OFF YOUR OLMESARTAN TO SEE IF THAT CAN BE A CAUSE OF THE  DIARRHEA  Add fiber like benefiber or citracel once a day Can do imodium as needed Please try low FODMAP diet- see below- start with just one column at a time.   FODMAP stands for fermentable oligo-, di-, mono-saccharides and polyols (1). These are the scientific terms used to classify groups of carbs that are notorious for triggering digestive symptoms like bloating, gas and stomach pain.     Thank you for entrusting me with your care and choosing [facility].  Vicie Mutters, PA-C

## 2022-04-23 ENCOUNTER — Telehealth: Payer: Self-pay | Admitting: Physician Assistant

## 2022-04-23 LAB — IBC + FERRITIN
Ferritin: 11.1 ng/mL (ref 10.0–291.0)
Iron: 74 ug/dL (ref 42–145)
Saturation Ratios: 18 % — ABNORMAL LOW (ref 20.0–50.0)
TIBC: 410.2 ug/dL (ref 250.0–450.0)
Transferrin: 293 mg/dL (ref 212.0–360.0)

## 2022-04-23 LAB — IGA: Immunoglobulin A: 93 mg/dL (ref 70–320)

## 2022-04-23 LAB — TISSUE TRANSGLUTAMINASE, IGA: (tTG) Ab, IgA: <1 U/mL

## 2022-04-23 NOTE — Telephone Encounter (Signed)
Patient returned your call, please advise. 

## 2022-04-23 NOTE — Telephone Encounter (Signed)
Refer to lab result note 04/22/22.

## 2022-04-24 ENCOUNTER — Other Ambulatory Visit: Payer: Medicare HMO

## 2022-04-24 DIAGNOSIS — A09 Infectious gastroenteritis and colitis, unspecified: Secondary | ICD-10-CM | POA: Diagnosis not present

## 2022-04-24 DIAGNOSIS — R197 Diarrhea, unspecified: Secondary | ICD-10-CM | POA: Diagnosis not present

## 2022-04-24 DIAGNOSIS — A048 Other specified bacterial intestinal infections: Secondary | ICD-10-CM | POA: Diagnosis not present

## 2022-04-25 NOTE — Progress Notes (Signed)
Agree with assessment/plan.  Raj Catelyn Friel, MD [facility] [phone]  

## 2022-04-26 LAB — GI PROFILE, STOOL, PCR
Adenovirus F 40/41: NOT DETECTED
Astrovirus: NOT DETECTED
C difficile toxin A/B: NOT DETECTED
Campylobacter: DETECTED — AB
Cryptosporidium: NOT DETECTED
Cyclospora cayetanensis: NOT DETECTED
Entamoeba histolytica: NOT DETECTED
Enteroaggregative E coli: NOT DETECTED
Enteropathogenic E coli: DETECTED — AB
Enterotoxigenic E coli: NOT DETECTED
Giardia lamblia: NOT DETECTED
Norovirus GI/GII: NOT DETECTED
Plesiomonas shigelloides: NOT DETECTED
Rotavirus A: NOT DETECTED
Salmonella: NOT DETECTED
Sapovirus: NOT DETECTED
Shiga-toxin-producing E coli: NOT DETECTED
Shigella/Enteroinvasive E coli: NOT DETECTED
Vibrio cholerae: NOT DETECTED
Vibrio: NOT DETECTED
Yersinia enterocolitica: NOT DETECTED

## 2022-04-28 ENCOUNTER — Other Ambulatory Visit: Payer: Self-pay

## 2022-04-28 MED ORDER — AZITHROMYCIN 500 MG PO TABS: 500.0000 mg | ORAL_TABLET | Freq: Every day | ORAL | 0 refills | Status: AC

## 2022-05-02 LAB — PANCREATIC ELASTASE, FECAL: Pancreatic Elastase-1, Stool: >500 mcg/g

## 2022-05-05 ENCOUNTER — Telehealth: Payer: Self-pay | Admitting: Physician Assistant

## 2022-05-06 LAB — FECAL FAT, QUALITATIVE
Fat Qual Neutral, Stl: NORMAL
Fat Qual Total, Stl: NORMAL

## 2022-06-05 ENCOUNTER — Encounter: Payer: Self-pay | Admitting: Internal Medicine

## 2022-06-05 ENCOUNTER — Ambulatory Visit (INDEPENDENT_AMBULATORY_CARE_PROVIDER_SITE_OTHER): Payer: Federal, State, Local not specified - PPO | Admitting: Internal Medicine

## 2022-06-05 DIAGNOSIS — G4733 Obstructive sleep apnea (adult) (pediatric): Secondary | ICD-10-CM

## 2022-06-05 DIAGNOSIS — J111 Influenza due to unidentified influenza virus with other respiratory manifestations: Secondary | ICD-10-CM | POA: Diagnosis not present

## 2022-06-05 MED ORDER — OSELTAMIVIR PHOSPHATE 45 MG PO CAPS: 45.0000 mg | ORAL_CAPSULE | Freq: Two times a day (BID) | ORAL | 0 refills | Status: AC

## 2022-06-05 MED ORDER — DM-GUAIFENESIN ER 30-600 MG PO TB12: 1.0000 | ORAL_TABLET | Freq: Two times a day (BID) | ORAL | 0 refills | Status: AC

## 2022-06-05 NOTE — Progress Notes (Signed)
  [facility] Internal Medicine Residency Telephone Encounter Continuity Care Appointment  HPI:  This telephone encounter was created for [name] on 06/05/2022 for the following purpose/cc not feeling well. Reports for past day and a half she has been experiencing headache, sinus pain, sore throat, myalgias, and dry cough. No fever. She is not up to date on flu or COVID vaccines. She tested negative for COVID, has not tested for flu or strep. Has tried Claritin and benadryl  which have provided some relief. No sick contacts. She is on camping trip with husband who is not sick either.   Also reports CPAP machine has broken. Says she called company and was told to have MD place new order.     Past Medical History:  Past Medical History:  Diagnosis Date   ADHD 01/30/2020   Anxiety    Arthritis    Depression 01/30/2020   Elevated cholesterol    Fibromyalgia 01/30/2020   Healthcare maintenance 08/11/2020   Hypertension    Insomnia 10/21/2020   Murmur 09/23/2020   OSA (obstructive sleep apnea) 01/30/2020   Pneumonia    Stable angina ([place]) 08/09/2020     ROS:     Assessment / Plan / Recommendations:  Please see A&P under problem oriented charting for assessment of the patient's acute and chronic medical conditions.  Symptoms most consistent with flu. She is within time frame to benefit from tamiflu. Will also provide with cough suppressant. As always, pt is advised that if symptoms worsen or new symptoms arise, they should go to an urgent care facility or to to ER for further evaluation.   Consent and Medical Decision Making:  Patient discussed with Dr. Philipp Ovens This is a telephone encounter between [facility] and [name] on 06/05/2022 for flu. The visit was conducted with the patient located at home and [name] at [facility]. The patient's identity was confirmed using their DOB and current address. The patient has consented to being evaluated  through a telephone encounter and understands the associated risks (an examination cannot be done and the patient may need to come in for an appointment) / benefits (allows the patient to remain at home, decreasing exposure to coronavirus). I personally spent 15 minutes on medical discussion.

## 2022-06-05 NOTE — Assessment & Plan Note (Signed)
Reports for past day and a half she has been experiencing headache, sinus pain, sore throat, myalgias, and dry cough. No fever. She is not up to date on flu or COVID vaccines. She tested negative for COVID, has not tested for flu or strep. Has tried Claritin and benadryl  which have provided some relief. No sick contacts. She is on camping trip with husband who is not sick either.   Symptoms most consistent with flu. She is within time frame to benefit from tamiflu. Will also provide with cough suppressant.

## 2022-06-08 ENCOUNTER — Encounter: Payer: Medicare HMO | Admitting: Gastroenterology

## 2022-06-08 NOTE — Progress Notes (Signed)
Internal Medicine Clinic Attending ° °Case discussed with Dr. Gawaluck  At the time of the visit.  We reviewed the resident’s history and exam and pertinent patient test results.  I agree with the assessment, diagnosis, and plan of care documented in the resident’s note.  °

## 2022-06-08 NOTE — Addendum Note (Signed)
Addended by: Jodean Lima on: 06/08/2022 08:42 AM   Modules accepted: Level of Service

## 2022-06-12 DIAGNOSIS — H524 Presbyopia: Secondary | ICD-10-CM | POA: Diagnosis not present

## 2022-06-12 DIAGNOSIS — H25813 Combined forms of age-related cataract, bilateral: Secondary | ICD-10-CM | POA: Diagnosis not present

## 2022-06-16 ENCOUNTER — Encounter: Payer: Medicare HMO | Admitting: Student

## 2022-06-16 ENCOUNTER — Encounter: Payer: Self-pay | Admitting: Student

## 2022-06-19 ENCOUNTER — Encounter: Payer: Medicare HMO | Admitting: Gastroenterology

## 2022-06-19 DIAGNOSIS — G5711 Meralgia paresthetica, right lower limb: Secondary | ICD-10-CM | POA: Diagnosis not present

## 2022-06-25 DIAGNOSIS — Z0100 Encounter for examination of eyes and vision without abnormal findings: Secondary | ICD-10-CM | POA: Diagnosis not present

## 2022-06-29 ENCOUNTER — Encounter: Payer: Self-pay | Admitting: Gastroenterology

## 2022-06-29 ENCOUNTER — Ambulatory Visit (AMBULATORY_SURGERY_CENTER): Payer: Federal, State, Local not specified - PPO | Admitting: Gastroenterology

## 2022-06-29 VITALS — BP 127/79 | HR 46 | Temp 97.3°F | Resp 11 | Ht 62.0 in | Wt 192.0 lb

## 2022-06-29 DIAGNOSIS — Z1211 Encounter for screening for malignant neoplasm of colon: Secondary | ICD-10-CM

## 2022-06-29 DIAGNOSIS — I10 Essential (primary) hypertension: Secondary | ICD-10-CM | POA: Diagnosis not present

## 2022-06-29 DIAGNOSIS — D123 Benign neoplasm of transverse colon: Secondary | ICD-10-CM

## 2022-06-29 DIAGNOSIS — I2510 Atherosclerotic heart disease of native coronary artery without angina pectoris: Secondary | ICD-10-CM | POA: Diagnosis not present

## 2022-06-29 DIAGNOSIS — R197 Diarrhea, unspecified: Secondary | ICD-10-CM

## 2022-06-29 DIAGNOSIS — G4733 Obstructive sleep apnea (adult) (pediatric): Secondary | ICD-10-CM | POA: Diagnosis not present

## 2022-06-29 MED ORDER — SODIUM CHLORIDE 0.9 % IV SOLN: 500.0000 mL | INTRAVENOUS | Status: DC

## 2022-06-29 NOTE — Progress Notes (Signed)
[facility] History and Physical   Primary Care Physician:  Virl Axe, MD   Reason for Procedure:   screening  Plan:    Colon today Pl see last note from [facility] 8/30 Diarrhea has resolved.      HPI: Alexa Rodriguez is a 66 y.o. female  No nausea, vomiting, heartburn, regurgitation, odynophagia or dysphagia.  No significant diarrhea or constipation.  No melena or hematochezia. No unintentional weight loss. No abdominal pain.   Past Medical History:  Diagnosis Date   ADHD 01/30/2020   Anxiety    Arthritis    Depression 01/30/2020   Elevated cholesterol    Fibromyalgia 01/30/2020   Healthcare maintenance 08/11/2020   Hypertension    Insomnia 10/21/2020   Murmur 09/23/2020   OSA (obstructive sleep apnea) 01/30/2020   Pneumonia    Stable angina 08/09/2020    Past Surgical History:  Procedure Laterality Date   BREAST LUMPECTOMY Left 1990   benign   CARDIAC CATHETERIZATION     2004 in [place]     right knee   TONSILLECTOMY     TRANSFORAMINAL LUMBAR INTERBODY FUSION W/ MIS 1 LEVEL N/A 02/09/2022   Procedure: LUMBAR FOUR-FIVE MINIMALLY INVASIVE (MIS) TRANSFORAMINAL LUMBAR INTERBODY FUSION;  Surgeon: Judith Part, MD;  Location: [facility];  Service: Neurosurgery;  Laterality: N/A;   VAGINAL HYSTERECTOMY      Prior to Admission medications   Medication Sig Start Date End Date Taking? Authorizing Provider  atorvastatin (LIPITOR) 10 MG tablet Take 10 mg by mouth daily.    [provider]  carboxymethylcellulose (REFRESH PLUS) 0.5 % SOLN Place 1 drop into both eyes daily.    [provider]  COLLAGEN PO Take 2 capsules by mouth daily.    [provider]  DULoxetine (CYMBALTA) 60 MG capsule Take 1 capsule by mouth once daily 12/08/21   Virl Axe, MD  fluorometholone (FML) 0.1 % ophthalmic ointment 1 application. 4 (four) times daily as needed (dry/itchy eyes). Patient not taking: Reported on  04/22/2022    [provider]  methocarbamol (ROBAXIN) 750 MG tablet Take 1 tablet (750 mg total) by mouth every 6 (six) hours as needed for muscle spasms. 02/10/22   Judith Part, MD  Multiple Vitamins-Minerals (MULTIVITAMIN WITH MINERALS) tablet Take 1 tablet by mouth daily. Patient not taking: Reported on 04/22/2022    [provider]  nitroGLYCERIN (NITROSTAT) 0.4 MG SL tablet Place 1 tablet (0.4 mg total) under the tongue every 5 (five) minutes as needed for chest pain. 08/08/20   Seawell, Jaimie A, DO  olmesartan (BENICAR) 20 MG tablet Take 10 mg by mouth daily.    [provider]  OVER THE COUNTER MEDICATION Take 12 mg by mouth at bedtime. CBD 12 mg XITE with Delta 9 (9 mg)    [provider]  OVER THE COUNTER MEDICATION 1 Package daily. Activate C - immune complex    [provider]  pregabalin (LYRICA) 50 MG capsule Take 50 mg by mouth 2 (two) times daily. 11/05/21   [provider]  Sod Picosulfate-Mag Ox-Cit Acd (CLENPIQ) 10-3.5-12 MG-GM -GM/160ML SOLN Take 1 kit by mouth as directed. 04/22/22   Vladimir Crofts, PA-C  Vitamin D-Vitamin K (K2 PLUS D3 PO) Take 1 tablet by mouth daily.    [provider]    Current Outpatient Medications  Medication Sig Dispense Refill   atorvastatin (LIPITOR) 10 MG tablet Take 10 mg by mouth daily.  carboxymethylcellulose (REFRESH PLUS) 0.5 % SOLN Place 1 drop into both eyes daily.     COLLAGEN PO Take 2 capsules by mouth daily.     DULoxetine (CYMBALTA) 60 MG capsule Take 1 capsule by mouth once daily 60 capsule 0   fluorometholone (FML) 0.1 % ophthalmic ointment 1 application. 4 (four) times daily as needed (dry/itchy eyes). (Patient not taking: Reported on 04/22/2022)     methocarbamol (ROBAXIN) 750 MG tablet Take 1 tablet (750 mg total) by mouth every 6 (six) hours as needed for muscle spasms. 30 tablet 2   Multiple Vitamins-Minerals (MULTIVITAMIN WITH MINERALS) tablet Take 1  tablet by mouth daily. (Patient not taking: Reported on 04/22/2022)     nitroGLYCERIN (NITROSTAT) 0.4 MG SL tablet Place 1 tablet (0.4 mg total) under the tongue every 5 (five) minutes as needed for chest pain. 5 tablet 1   olmesartan (BENICAR) 20 MG tablet Take 10 mg by mouth daily.     OVER THE COUNTER MEDICATION Take 12 mg by mouth at bedtime. CBD 12 mg XITE with Delta 9 (9 mg)     OVER THE COUNTER MEDICATION 1 Package daily. Activate C - immune complex     pregabalin (LYRICA) 50 MG capsule Take 50 mg by mouth 2 (two) times daily.     Sod Picosulfate-Mag Ox-Cit Acd (CLENPIQ) 10-3.5-12 MG-GM -GM/160ML SOLN Take 1 kit by mouth as directed. 320 mL 0   Vitamin D-Vitamin K (K2 PLUS D3 PO) Take 1 tablet by mouth daily.     Current Facility-Administered Medications  Medication Dose Route Frequency Provider Last Rate Last Admin   0.9 %  sodium chloride infusion  500 mL Intravenous Continuous Jackquline Denmark, MD        Allergies as of 06/29/2022 - Review Complete 06/29/2022  Allergen Reaction Noted   Lopressor [metoprolol tartrate] Other (See Comments) 01/30/2020   Cortisone Hypertension 01/30/2020   Codeine Nausea And Vomiting 02/09/2022   Latex Rash 02/09/2022   Neurontin [gabapentin] Anxiety and Other (See Comments) 01/28/2022    Family History  Problem Relation Age of Onset   Clotting disorder Mother    Heart disease Mother    Atrial fibrillation Mother    Heart disease Father    Kidney disease Maternal Grandmother    Diabetes Maternal Grandmother    Heart disease Paternal Grandmother    Heart disease Paternal Grandfather    Colon cancer Neg Hx    Esophageal cancer Neg Hx    Pancreatic cancer Neg Hx    Stomach cancer Neg Hx     Social History   Socioeconomic History   Marital status: Married    Spouse name: Not on file   Number of children: Not on file   Years of education: Not on file   Highest education level: Not on file  Occupational History   Not on file  Tobacco Use    Smoking status: Former    Packs/day: 1.50    Years: 20.00    Total pack years: 30.00    Types: Cigarettes    Quit date: 08/24/2009    Years since quitting: 12.8   Smokeless tobacco: Never  Vaping Use   Vaping Use: Former  Substance and Sexual Activity   Alcohol use: Yes    Alcohol/week: 2.0 standard drinks of alcohol    Types: 1 Glasses of wine, 1 Cans of beer per week    Comment: 3 times per week   Drug use: Not on file   Sexual activity: Not  on file  Other Topics Concern   Not on file  Social History Narrative   Not on file   Social Determinants of Health   Financial Resource Strain: Not on file  Food Insecurity: Not on file  Transportation Needs: Not on file  Physical Activity: Not on file  Stress: Not on file  Social Connections: Not on file  Intimate Partner Violence: Not on file    Review of Systems: Positive for none All other review of systems negative except as mentioned in the HPI.  Physical Exam: Vital signs in last 24 hours: _0 @   General:   Alert,  Well-developed, well-nourished, pleasant and cooperative in NAD Lungs:  Clear throughout to auscultation.   Heart:  Regular rate and rhythm; no murmurs, clicks, rubs,  or gallops. Abdomen:  Soft, nontender and nondistended. Normal bowel sounds.   Neuro/Psych:  Alert and cooperative. Normal mood and affect. A and O x 3    No significant changes were identified.  The patient continues to be an appropriate candidate for the planned procedure and anesthesia.   Carmell Austria, MD. [facility] 06/29/2022 2:09 PM@

## 2022-06-29 NOTE — Patient Instructions (Signed)
Read all of the handouts given to you by your recovery room nurse.  Resume all of your medications.  Try to eat a high fiber diet, and drink plenty of water.  YOU HAD AN ENDOSCOPIC PROCEDURE TODAY AT [facility]:   Refer to the procedure report that was given to you for any specific questions about what was found during the examination.  If the procedure report does not answer your questions, please call your gastroenterologist to clarify.  If you requested that your care partner not be given the details of your procedure findings, then the procedure report has been included in a sealed envelope for you to review at your convenience later.  YOU SHOULD EXPECT: Some feelings of bloating in the abdomen. Passage of more gas than usual.  Walking can help get rid of the air that was put into your GI tract during the procedure and reduce the bloating. If you had a lower endoscopy (such as a colonoscopy or flexible sigmoidoscopy) you may notice spotting of blood in your stool or on the toilet paper. If you underwent a bowel prep for your procedure, you may not have a normal bowel movement for a few days.  Please Note:  You might notice some irritation and congestion in your nose or some drainage.  This is from the oxygen used during your procedure.  There is no need for concern and it should clear up in a day or so.  SYMPTOMS TO REPORT IMMEDIATELY:  Following lower endoscopy (colonoscopy or flexible sigmoidoscopy):  Excessive amounts of blood in the stool  Significant tenderness or worsening of abdominal pains  Swelling of the abdomen that is new, acute  Fever of 100F or higher  For urgent or emergent issues, a gastroenterologist can be reached at any hour by calling [phone]. Do not use MyChart messaging for urgent concerns.    DIET:  We do recommend a small meal at first, but then you may proceed to your regular diet.  Drink plenty of fluids but you should avoid alcoholic  beverages for 24 hours.  ACTIVITY:  You should plan to take it easy for the rest of today and you should NOT DRIVE or use heavy machinery until tomorrow (because of the sedation medicines used during the test).    FOLLOW UP: Our staff will call the number listed on your records the next business day following your procedure.  We will call around 7:15- 8:00 am to check on you and address any questions or concerns that you may have regarding the information given to you following your procedure. If we do not reach you, we will leave a message.     If any biopsies were taken you will be contacted by phone or by letter within the next 1-3 weeks.  Please call us at [phone] if you have not heard about the biopsies in 3 weeks.    SIGNATURES/CONFIDENTIALITY: You and/or your care partner have signed paperwork which will be entered into your electronic medical record.  These signatures attest to the fact that that the information above on your After Visit Summary has been reviewed and is understood.  Full responsibility of the confidentiality of this discharge information lies with you and/or your care-partner.

## 2022-06-29 NOTE — Progress Notes (Signed)
Report to PACU, RN, vss, BBS= Clear.  

## 2022-06-29 NOTE — Op Note (Signed)
[facility] Patient Name: Alexa Rodriguez Procedure Date: 06/29/2022 2:04 PM MRN: [id number] Endoscopist: Jackquline Denmark , MD, [id number] Age: 66 Referring MD:  Date of Birth: 04/10/1956 Gender: Female Account #: [id number] Procedure:                Colonoscopy Indications:              Screening for colorectal malignant neoplasm. Medicines:                Monitored Anesthesia Care Procedure:                Pre-Anesthesia Assessment:                           - Prior to the procedure, a History and Physical                            was performed, and patient medications and                            allergies were reviewed. The patient's tolerance of                            previous anesthesia was also reviewed. The risks                            and benefits of the procedure and the sedation                            options and risks were discussed with the patient.                            All questions were answered, and informed consent                            was obtained. Prior Anticoagulants: The patient has                            taken no anticoagulant or antiplatelet agents. ASA                            Grade Assessment: II - A patient with mild systemic                            disease. After reviewing the risks and benefits,                            the patient was deemed in satisfactory condition to                            undergo the procedure.                           After obtaining informed consent, the colonoscope  was passed under direct vision. Throughout the                            procedure, the patient's blood pressure, pulse, and                            oxygen saturations were monitored continuously. The                            PCF-HQ190L Colonoscope was introduced through the                            anus and advanced to the 2 cm into the ileum. The                             colonoscopy was performed without difficulty. The                            patient tolerated the procedure well. The quality                            of the bowel preparation was adequate to identify                            polyps greater than 5 mm in size. Pt had dinner                            last night. The terminal ileum, ileocecal valve,                            appendiceal orifice, and rectum were photographed. Scope In: 2:16:33 PM Scope Out: 2:30:57 PM Scope Withdrawal Time: 0 hours 10 minutes 23 seconds  Total Procedure Duration: 0 hours 14 minutes 24 seconds  Findings:                 A 6 mm polyp was found in the mid transverse colon.                            The polyp was sessile. The polyp was removed with a                            cold snare. Resection and retrieval were complete.                           Multiple medium-mouthed diverticula were found in                            the sigmoid colon, descending colon and few in                            ascending colon.  Non-bleeding internal hemorrhoids were found during                            retroflexion. The hemorrhoids were small and Grade                            I (internal hemorrhoids that do not prolapse).                           The terminal ileum appeared normal.                           The exam was otherwise without abnormality on                            direct and retroflexion views. Complications:            No immediate complications. Estimated Blood Loss:     Estimated blood loss: none. Impression:               - One 6 mm polyp in the mid transverse colon,                            removed with a cold snare. Resected and retrieved.                           - Diverticulosis in the sigmoid colon, in the                            descending colon and few in in the ascending colon.                           - Non-bleeding internal hemorrhoids.                            - The examined portion of the ileum was normal.                           - The examination was otherwise normal on direct                            and retroflexion views. Recommendation:           - Patient has a contact number available for                            emergencies. The signs and symptoms of potential                            delayed complications were discussed with the                            patient. Return to normal activities tomorrow.  Written discharge instructions were provided to the                            patient.                           - High fiber diet.                           - Continue present medications.                           - Await pathology results.                           - Repeat colonoscopy in 3 years (likely) for                            surveillance after 2 day prep.                           - The findings and recommendations were discussed                            with the patient's family. Jackquline Denmark, MD 06/29/2022 2:39:14 PM This report has been signed electronically.

## 2022-06-29 NOTE — Progress Notes (Signed)
Called to room to assist during endoscopic procedure.  Patient ID and intended procedure confirmed with present staff. Received instructions for my participation in the procedure from the performing physician.  

## 2022-06-30 ENCOUNTER — Telehealth: Payer: Self-pay

## 2022-06-30 ENCOUNTER — Other Ambulatory Visit: Payer: Self-pay

## 2022-06-30 NOTE — Telephone Encounter (Signed)
  Follow up Call-     06/29/2022    2:02 PM  Call back number  Post procedure Call Back phone  # [phone]  Permission to leave phone message Yes     Patient questions:  Do you have a fever, pain , or abdominal swelling? No. Pain Score  0 *  Have you tolerated food without any problems? Yes.    Have you been able to return to your normal activities? Yes.    Do you have any questions about your discharge instructions: Diet   No. Medications  No. Follow up visit  No.  Do you have questions or concerns about your Care? No.  Actions: * If pain score is 4 or above: No action needed, pain <4.

## 2022-07-06 ENCOUNTER — Encounter: Payer: Self-pay | Admitting: Gastroenterology

## 2022-07-24 DIAGNOSIS — J349 Unspecified disorder of nose and nasal sinuses: Secondary | ICD-10-CM | POA: Diagnosis not present

## 2022-07-24 DIAGNOSIS — J069 Acute upper respiratory infection, unspecified: Secondary | ICD-10-CM | POA: Diagnosis not present

## 2022-07-25 ENCOUNTER — Other Ambulatory Visit: Payer: Self-pay | Admitting: Internal Medicine

## 2022-07-27 NOTE — Telephone Encounter (Signed)
RTC from patient. Accidentally requested refill for Tamiflu. Does not have the flu.

## 2022-07-27 NOTE — Telephone Encounter (Signed)
Call to patient to inquire about her need for a refill of the Tamiflu.  Message left or patient to call the Clinics.

## 2022-08-11 ENCOUNTER — Ambulatory Visit (INDEPENDENT_AMBULATORY_CARE_PROVIDER_SITE_OTHER): Payer: Federal, State, Local not specified - PPO | Admitting: Student

## 2022-08-11 ENCOUNTER — Encounter: Payer: Self-pay | Admitting: Student

## 2022-08-11 VITALS — BP 116/72 | HR 72 | Temp 97.8°F | Ht 62.0 in | Wt 189.0 lb

## 2022-08-11 DIAGNOSIS — M79661 Pain in right lower leg: Secondary | ICD-10-CM

## 2022-08-11 DIAGNOSIS — T148XXA Other injury of unspecified body region, initial encounter: Secondary | ICD-10-CM

## 2022-08-11 DIAGNOSIS — G4733 Obstructive sleep apnea (adult) (pediatric): Secondary | ICD-10-CM | POA: Diagnosis not present

## 2022-08-11 NOTE — Patient Instructions (Addendum)
Please use muscle rubs like biofreeze to help with the pain that you have. Just use tylenol for your pain.

## 2022-08-11 NOTE — Progress Notes (Unsigned)
   CC: L Leg pain   HPI:  [name] is a 66 y.o. F with PMH per below who presents for L leg pain.   Patient states that about 5 days prior to today's clinic visit she was awoken out of her sleep with severe L leg pain. The pain was primarily to the left lateral aspect of her L calf. She did not notice any bruising or erythema of the overlying skin. She states that 2 d prior to this event she had been climbing up her ladder which is an activity she does not regularly do because she injured herself climbing up a ladder in the past. Although her pain improved after taking aspirin she was concerned that she might have a DVT prompting her to present to clinic.   She denies any fevers or chills.   Past Medical History:  Diagnosis Date   ADHD 01/30/2020   Anxiety    Arthritis    Depression 01/30/2020   Elevated cholesterol    Fibromyalgia 01/30/2020   Healthcare maintenance 08/11/2020   Hypertension    Insomnia 10/21/2020   Murmur 09/23/2020   OSA (obstructive sleep apnea) 01/30/2020   Pneumonia    Stable angina 08/09/2020   Review of Systems:  Negative except per above.   Physical Exam:  Vitals:   08/11/22 1537  BP: 116/72  Pulse: 72  Temp: 97.8 F (36.6 C)  TempSrc: Oral  SpO2: 98%  Weight: 189 lb (85.7 kg)  Height: 5\' 2"  (1.575 m)   Constitutional: Well-developed, well-nourished, and in no distress.  HENT:  Head: Normocephalic and atraumatic.  Eyes: EOM are normal.  Neck: Normal range of motion.  Cardiovascular: Normal rate, regular rhythm, intact distal pulses. No gallop and no friction rub.  No murmur heard. No lower extremity edema  Pulmonary: Non labored breathing on room air, no wheezing or rales  Abdominal: Soft. Normal bowel sounds. Non distended and non tender Musculoskeletal: Normal range of motion.   L lower extremity with mild focal TTP at belly of peroneal brevis muscle. Overlying skin with no erythema, no edema, no fluctuance.   Neurological:  Alert and oriented to person, place, and time. Non focal  Skin: Skin is warm and dry.    Assessment & Plan:   See Encounters Tab for problem based charting.  Patient discussed with Dr.  Sol Blazing

## 2022-08-12 DIAGNOSIS — T148XXA Other injury of unspecified body region, initial encounter: Secondary | ICD-10-CM | POA: Insufficient documentation

## 2022-08-12 NOTE — Assessment & Plan Note (Signed)
Patient presents with subacute L lower extremity pain after engaging in increased activity. She has some focal TTP near the belly of the peroneal brevis muscle. She has no signs of infection.   Her symptoms are likely due to mild muscle strain. Her symptoms have improved with time. Discussed with patient to rest the muscle, use ice, and tylenol if needed. Discussed that there is a low suspicion for DVT at this time.

## 2022-08-13 NOTE — Progress Notes (Signed)
Internal Medicine Clinic Attending  Case discussed with Dr. Carter  At the time of the visit.  We reviewed the resident's history and exam and pertinent patient test results.  I agree with the assessment, diagnosis, and plan of care documented in the resident's note.  

## 2022-08-13 NOTE — Addendum Note (Signed)
Addended by: Althea Grimmer on: 08/13/2022 11:57 AM   Modules accepted: Orders

## 2022-08-13 NOTE — Assessment & Plan Note (Signed)
Patient's CPAP machine malfunctioned and she is in need of a new CPAP machine. She had her sleep study when she was living in [place].   She will get her sleep study faxed to [place] and then we will fax this along with a new prescription for a CPAP machine to [facility] [phone].

## 2022-08-13 NOTE — Addendum Note (Signed)
Addended by: Althea Grimmer on: 08/13/2022 11:50 AM   Modules accepted: Orders

## 2022-08-13 NOTE — Addendum Note (Signed)
Addended by: Charise Killian on: 08/13/2022 08:12 AM   Modules accepted: Level of Service

## 2022-10-15 ENCOUNTER — Other Ambulatory Visit: Payer: Self-pay | Admitting: Student

## 2022-10-15 DIAGNOSIS — M797 Fibromyalgia: Secondary | ICD-10-CM

## 2022-10-19 DIAGNOSIS — G5711 Meralgia paresthetica, right lower limb: Secondary | ICD-10-CM | POA: Diagnosis not present

## 2022-10-19 DIAGNOSIS — G5712 Meralgia paresthetica, left lower limb: Secondary | ICD-10-CM | POA: Diagnosis not present

## 2022-10-19 DIAGNOSIS — Z6834 Body mass index (BMI) 34.0-34.9, adult: Secondary | ICD-10-CM | POA: Diagnosis not present

## 2022-10-20 DIAGNOSIS — G4733 Obstructive sleep apnea (adult) (pediatric): Secondary | ICD-10-CM | POA: Diagnosis not present

## 2022-10-29 DIAGNOSIS — M5416 Radiculopathy, lumbar region: Secondary | ICD-10-CM | POA: Diagnosis not present

## 2022-10-30 ENCOUNTER — Ambulatory Visit (INDEPENDENT_AMBULATORY_CARE_PROVIDER_SITE_OTHER): Payer: PPO | Admitting: Student

## 2022-10-30 ENCOUNTER — Encounter: Payer: Self-pay | Admitting: Student

## 2022-10-30 ENCOUNTER — Ambulatory Visit (INDEPENDENT_AMBULATORY_CARE_PROVIDER_SITE_OTHER): Payer: PPO

## 2022-10-30 VITALS — BP 139/61 | HR 72 | Temp 98.2°F | Ht 62.0 in | Wt 189.2 lb

## 2022-10-30 DIAGNOSIS — M48061 Spinal stenosis, lumbar region without neurogenic claudication: Secondary | ICD-10-CM

## 2022-10-30 DIAGNOSIS — H9201 Otalgia, right ear: Secondary | ICD-10-CM

## 2022-10-30 DIAGNOSIS — Z0000 Encounter for general adult medical examination without abnormal findings: Secondary | ICD-10-CM

## 2022-10-30 DIAGNOSIS — G8929 Other chronic pain: Secondary | ICD-10-CM | POA: Diagnosis not present

## 2022-10-30 DIAGNOSIS — M25561 Pain in right knee: Secondary | ICD-10-CM

## 2022-10-30 DIAGNOSIS — I872 Venous insufficiency (chronic) (peripheral): Secondary | ICD-10-CM | POA: Diagnosis not present

## 2022-10-30 DIAGNOSIS — M4316 Spondylolisthesis, lumbar region: Secondary | ICD-10-CM

## 2022-10-30 DIAGNOSIS — M797 Fibromyalgia: Secondary | ICD-10-CM

## 2022-10-30 DIAGNOSIS — R7303 Prediabetes: Secondary | ICD-10-CM | POA: Diagnosis not present

## 2022-10-30 LAB — POCT GLYCOSYLATED HEMOGLOBIN (HGB A1C): Hemoglobin A1C: 5.6 % (ref 4.0–5.6)

## 2022-10-30 LAB — GLUCOSE, CAPILLARY: Glucose-Capillary: 147 mg/dL — ABNORMAL HIGH (ref 70–99)

## 2022-10-30 MED ORDER — PREGABALIN 50 MG PO CAPS: 50.0000 mg | ORAL_CAPSULE | Freq: Two times a day (BID) | ORAL | 0 refills | Status: DC

## 2022-10-30 NOTE — Assessment & Plan Note (Signed)
Patient is here for evaluation of bilateral lower extremity swelling.  This is a chronic issue and has been going on for years.  She report worsening swelling at the end of her day and better when she wakes up.  Reports mild tenderness of her calf and behind her knees bilaterally that is not worse with exertion or better with rest.  Patient denies any family history of hypercoagulable state, no personal history of cancer or immobilization.  She is not on any estrogen therapy.  Patient has chronic spinal stenosis underwent L4-L5 fusion in June 2023.  She also received epidural injection yesterday which helped with her bilateral leg pain.  Physical exam showed trace to +1 edema bilateral lower extremity, worse in the ankles.  There was no skin erythema.  She has mild tenderness to palpation of her lower legs especially with the varicose veins.  She has normal range of motion of bilateral knee and ankle joints.  Her exam and history are consistent with venous insufficiencies.  Low suspicion for DVT given the chronicity.  Advised patient on compression socks and leg elevation.

## 2022-10-30 NOTE — Progress Notes (Signed)
Subjective:   Jahara Glade is a 67 y.o. female who presents for an Initial Medicare Annual Wellness Visit. I connected with  Lovene Labruyere-May on 10/30/22 by a  Face-To-Face encounter  and verified that I am speaking with the correct person using two identifiers.  Patient Location: Other:  Office/Clinic  Provider Location: Office/Clinic  I discussed the limitations of evaluation and management by telemedicine. The patient expressed understanding and agreed to proceed.  Review of Systems    Defer to PCP       Objective:    Today's Vitals   10/30/22 0950  BP: 139/61  Pulse: 72  Temp: 98.2 F (36.8 C)  TempSrc: Oral  SpO2: 100%  Weight: 189 lb 3.2 oz (85.8 kg)  Height: '5\' 2"'$  (1.575 m)   Body mass index is 34.61 kg/m.     10/30/2022    9:52 AM 10/30/2022    9:34 AM 08/11/2022    3:41 PM 02/03/2022    1:33 PM 01/30/2022   11:05 AM 10/01/2021    2:48 PM 01/29/2021    1:20 PM  Advanced Directives  Does Patient Have a Medical Advance Directive? Yes Yes Yes Yes Yes Yes No  Type of Academic librarian Living will [place];Living will [place];Living will [place];Living will [place];Living will   Does patient want to make changes to medical advance directive?   No - Patient declined      Copy of [place] in Chart? Yes - validated most recent copy scanned in chart (See row information)  Yes - validated most recent copy scanned in chart (See row information)  Yes - validated most recent copy scanned in chart (See row information) No - copy requested   Would patient like information on creating a medical advance directive?       Yes (MAU/Ambulatory/Procedural Areas - Information given)    Current Medications (verified) Outpatient Encounter Medications as of 10/30/2022  Medication Sig   atorvastatin (LIPITOR) 10 MG tablet Take 10 mg by mouth daily.    carboxymethylcellulose (REFRESH PLUS) 0.5 % SOLN Place 1 drop into both eyes daily.   COLLAGEN PO Take 2 capsules by mouth daily.   DULoxetine (CYMBALTA) 60 MG capsule Take 1 capsule by mouth once daily   fluorometholone (FML) 0.1 % ophthalmic ointment 1 application. 4 (four) times daily as needed (dry/itchy eyes). (Patient not taking: Reported on 04/22/2022)   methocarbamol (ROBAXIN) 750 MG tablet Take 1 tablet (750 mg total) by mouth every 6 (six) hours as needed for muscle spasms.   Multiple Vitamins-Minerals (MULTIVITAMIN WITH MINERALS) tablet Take 1 tablet by mouth daily. (Patient not taking: Reported on 04/22/2022)   nitroGLYCERIN (NITROSTAT) 0.4 MG SL tablet Place 1 tablet (0.4 mg total) under the tongue every 5 (five) minutes as needed for chest pain.   olmesartan (BENICAR) 20 MG tablet Take 10 mg by mouth daily.   OVER THE COUNTER MEDICATION Take 12 mg by mouth at bedtime. CBD 12 mg XITE with Delta 9 (9 mg)   OVER THE COUNTER MEDICATION 1 Package daily. Activate C - immune complex   Vitamin D-Vitamin K (K2 PLUS D3 PO) Take 1 tablet by mouth daily.   [DISCONTINUED] pregabalin (LYRICA) 50 MG capsule Take 50 mg by mouth 2 (two) times daily.   No facility-administered encounter medications on file as of 10/30/2022.    Allergies (verified) Lopressor [metoprolol tartrate], Cortisone, Codeine, Latex, and Neurontin [  gabapentin]   History: Past Medical History:  Diagnosis Date   ADHD 01/30/2020   Anxiety    Arthritis    Depression 01/30/2020   Elevated cholesterol    Fibromyalgia 01/30/2020   Healthcare maintenance 08/11/2020   Hypertension    Insomnia 10/21/2020   Murmur 09/23/2020   OSA (obstructive sleep apnea) 01/30/2020   Pneumonia    Stable angina 08/09/2020   Past Surgical History:  Procedure Laterality Date   BREAST LUMPECTOMY Left 1990   benign   CARDIAC CATHETERIZATION     2004 in [place]     right knee   TONSILLECTOMY     TRANSFORAMINAL LUMBAR  INTERBODY FUSION W/ MIS 1 LEVEL N/A 02/09/2022   Procedure: LUMBAR FOUR-FIVE MINIMALLY INVASIVE (MIS) TRANSFORAMINAL LUMBAR INTERBODY FUSION;  Surgeon: Judith Part, MD;  Location: [place] [facility];  Service: Neurosurgery;  Laterality: N/A;   VAGINAL HYSTERECTOMY     Family History  Problem Relation Age of Onset   Clotting disorder Mother    Heart disease Mother    Atrial fibrillation Mother    Heart disease Father    Kidney disease Maternal Grandmother    Diabetes Maternal Grandmother    Heart disease Paternal Grandmother    Heart disease Paternal Grandfather    Colon cancer Neg Hx    Esophageal cancer Neg Hx    Pancreatic cancer Neg Hx    Stomach cancer Neg Hx    Social History   Socioeconomic History   Marital status: Married    Spouse name: Not on file   Number of children: Not on file   Years of education: Not on file   Highest education level: Not on file  Occupational History   Not on file  Tobacco Use   Smoking status: Former    Packs/day: 1.50    Years: 20.00    Total pack years: 30.00    Types: Cigarettes    Quit date: 08/24/2009    Years since quitting: 13.1   Smokeless tobacco: Never  Vaping Use   Vaping Use: Former  Substance and Sexual Activity   Alcohol use: Yes    Alcohol/week: 2.0 standard drinks of alcohol    Types: 1 Glasses of wine, 1 Cans of beer per week    Comment: 3 times per week   Drug use: Not on file   Sexual activity: Not on file  Other Topics Concern   Not on file  Social History Narrative   Not on file   Social Determinants of Health   Financial Resource Strain: Low Risk  (10/30/2022)   Overall Financial Resource Strain (CARDIA)    Difficulty of Paying Living Expenses: Not very hard  Food Insecurity: Food Insecurity Present (10/30/2022)   Hunger Vital Sign    Worried About Running Out of Food in the Last Year: Sometimes true    Ran Out of Food in the Last Year: Sometimes true  Transportation Needs: No Transportation Needs (10/30/2022)    PRAPARE - Hydrologist (Medical): No    Lack of Transportation (Non-Medical): No  Physical Activity: Insufficiently Active (10/30/2022)   Exercise Vital Sign    Days of Exercise per Week: 4 days    Minutes of Exercise per Session: 30 min  Stress: Stress Concern Present (10/30/2022)   [place]    Feeling of Stress : To some extent  Social Connections: Socially Integrated (10/30/2022)   Social Connection  and Isolation Panel [NHANES]    Frequency of Communication with Friends and Family: More than three times a week    Frequency of Social Gatherings with Friends and Family: Twice a week    Attends Religious Services: More than 4 times per year    Active Member of [employer] or Organizations: Yes    Attends Music therapist: More than 4 times per year    Marital Status: Married    Tobacco Counseling Counseling given: Not Answered   Clinical Intake:  Pre-visit preparation completed: Yes  Pain : 0-10 Pain Type: Chronic pain Pain Location: Leg Pain Orientation: Right, Left Pain Descriptors / Indicators: Aching Pain Onset: More than a month ago Pain Frequency: Constant     Nutritional Risks: None Diabetes: No  How often do you need to have someone help you when you read instructions, pamphlets, or other written materials from your doctor or pharmacy?: 1 - Never What is the last grade level you completed in school?: college  Diabetic?No  Interpreter Needed?: No  Information entered by :: Keny Donald,cma   Activities of Daily Living    10/30/2022    9:52 AM 10/30/2022    9:34 AM  In your present state of health, do you have any difficulty performing the following activities:  Hearing? 0 0  Vision? 0 0  Difficulty concentrating or making decisions? 0 0  Walking or climbing stairs? 1 1  Dressing or bathing? 0 0  Doing errands, shopping? 0 0    Patient Care  Team: Virl Axe, MD as PCP - General (Student) Berniece Salines, DO as PCP - Cardiology (Cardiology)  Indicate any recent Medical Services you may have received from other than [facility] providers in the past year (date may be approximate).     Assessment:   This is a routine wellness examination for Riona.  Hearing/Vision screen No results found.  Dietary issues and exercise activities discussed:     Goals Addressed   None   Depression Screen    10/30/2022    9:52 AM 10/30/2022    9:33 AM 01/30/2022   11:04 AM 10/01/2021    2:45 PM 01/29/2021    2:08 PM 10/21/2020    3:30 PM 08/08/2020    4:07 PM  PHQ 2/9 Scores  PHQ - 2 Score   0 2 0 0 1  PHQ- 9 Score    11 5 0 9  Exception Documentation Patient refusal Patient refusal         Fall Risk    10/30/2022    9:52 AM 10/30/2022    9:33 AM 08/11/2022    3:40 PM 01/30/2022   11:04 AM 10/01/2021    2:04 PM  Fall Risk   Falls in the past year? '1 1 1 1 1  '$ Number falls in past yr: 1 1 0 1 1  Injury with Fall? 0 0 0 1 1  Risk for fall due to : History of fall(s);Impaired balance/gait History of fall(s);Impaired balance/gait  Impaired balance/gait Impaired balance/gait;History of fall(s)  Follow up Falls evaluation completed;Falls prevention discussed Falls evaluation completed;Falls prevention discussed Falls evaluation completed Falls evaluation completed;Falls prevention discussed Falls evaluation completed    FALL RISK PREVENTION PERTAINING TO THE HOME:  Any stairs in or around the home? Yes  If so, are there any without handrails? Yes  Home free of loose throw rugs in walkways, pet beds, electrical cords, etc? Yes  Adequate lighting in your home to reduce risk of falls? Yes  ASSISTIVE DEVICES UTILIZED TO PREVENT FALLS:  Life alert? No  Use of a cane, walker or w/c? Yes  Grab bars in the bathroom? No  Shower chair or bench in shower? Yes  Elevated toilet seat or a handicapped toilet? No   TIMED UP AND GO:  Gait slow and steady  without use of assistive device  Cognitive Function:        10/30/2022    9:52 AM  6CIT Screen  What Year? 0 points  What month? 0 points  What time? 0 points  Count back from 20 0 points  Months in reverse 0 points  Repeat phrase 0 points  Total Score 0 points    Immunizations Immunization History  Administered Date(s) Administered   Fluad Quad(high Dose 65+) 10/01/2021   PFIZER(Purple Top)SARS-COV-2 Vaccination 10/12/2019, 05/12/2020   PNEUMOCOCCAL CONJUGATE-20 10/01/2021   Td 01/29/2021    TDAP status: Up to date  Flu Vaccine status: Due, Education has been provided regarding the importance of this vaccine. Advised may receive this vaccine at local pharmacy or Health Dept. Aware to provide a copy of the vaccination record if obtained from local pharmacy or Health Dept. Verbalized acceptance and understanding.  Pneumococcal vaccine status: Up to date  Covid-19 vaccine status: Completed vaccines  Qualifies for Shingles Vaccine? No   Zostavax completed No   Shingrix Completed?: No.    Education has been provided regarding the importance of this vaccine. Patient has been advised to call insurance company to determine out of pocket expense if they have not yet received this vaccine. Advised may also receive vaccine at local pharmacy or Health Dept. Verbalized acceptance and understanding.  Screening Tests Health Maintenance  Topic Date Due   Hepatitis C Screening  Never done   Zoster Vaccines- Shingrix (1 of 2) Never done   Lung Cancer Screening  10/07/2021   INFLUENZA VACCINE  03/24/2022   COVID-19 Vaccine (3 - 2023-24 season) 04/24/2022   MAMMOGRAM  03/08/2023   Medicare Annual Wellness (AWV)  10/30/2023   COLONOSCOPY (Pts 45-24yr Insurance coverage will need to be confirmed)  06/29/2025   DTaP/Tdap/Td (2 - Tdap) 01/30/2031   Pneumonia Vaccine 67 Years old  Completed   DEXA SCAN  Completed   HPV VACCINES  Aged Out    Health Maintenance  Health Maintenance Due   Topic Date Due   Hepatitis C Screening  Never done   Zoster Vaccines- Shingrix (1 of 2) Never done   Lung Cancer Screening  10/07/2021   INFLUENZA VACCINE  03/24/2022   COVID-19 Vaccine (3 - 2023-24 season) 04/24/2022    Colorectal cancer screening: Type of screening: Colonoscopy. Completed 06/29/2022. Repeat every 3 years  Mammogram status: Ordered 02/25/2022. Pt provided with contact info and advised to call to schedule appt.    Lung Cancer Screening: (Low Dose CT Chest recommended if Age 67-80years, 30 pack-year currently smoking OR have quit w/in 15years.) does not qualify.   Lung Cancer Screening Referral: N/A  Additional Screening:   Vision Screening: Recommended annual ophthalmology exams for early detection of glaucoma and other disorders of the eye. Is the patient up to date with their annual eye exam?  Yes  Who is the provider or what is the name of the office in which the patient attends annual eye exams? N/A If pt is not established with a provider, would they like to be referred to a provider to establish care? No .   Dental Screening: Recommended annual dental exams for proper oral  hygiene  Community Resource Referral / Chronic Care Management: CRR required this visit?  No   CCM required this visit?  No      Plan:     I have personally reviewed and noted the following in the patient's chart:   Medical and social history Use of alcohol, tobacco or illicit drugs  Current medications and supplements including opioid prescriptions. Patient is not currently taking opioid prescriptions. Functional ability and status Nutritional status Physical activity Advanced directives List of other physicians Hospitalizations, surgeries, and ER visits in previous 12 months Vitals Screenings to include cognitive, depression, and falls Referrals and appointments  In addition, I have reviewed and discussed with patient certain preventive protocols, quality metrics, and  best practice recommendations. A written personalized care plan for preventive services as well as general preventive health recommendations were provided to patient.     Kerin Perna, [facility]   10/30/2022   Nurse Notes: Face-To-Face Visit  Ms. Labruyere-May , Thank you for taking time to come for your Medicare Wellness Visit. I appreciate your ongoing commitment to your health goals. Please review the following plan we discussed and let me know if I can assist you in the future.   These are the goals we discussed:  Goals   None     This is a list of the screening recommended for you and due dates:  Health Maintenance  Topic Date Due   Hepatitis C Screening: USPSTF Recommendation to screen - Ages 75-79 yo.  Never done   Zoster (Shingles) Vaccine (1 of 2) Never done   Screening for Lung Cancer  10/07/2021   Flu Shot  03/24/2022   COVID-19 Vaccine (3 - 2023-24 season) 04/24/2022   Mammogram  03/08/2023   Medicare Annual Wellness Visit  10/30/2023   Colon Cancer Screening  06/29/2025   DTaP/Tdap/Td vaccine (2 - Tdap) 01/30/2031   Pneumonia Vaccine  Completed   DEXA scan (bone density measurement)  Completed   HPV Vaccine  Aged Out

## 2022-10-30 NOTE — Assessment & Plan Note (Signed)
Patient reports improvement of her right knee pain but she still have persistent right lateral thigh burning sensation that is not improved after back surgery.  Nothing seems to trigger trigger the pain.  She said that Lyrica and duloxetine is helping tremendously.  She has not been taking Lyrica consistently due to sedating effects.  She states she has gained weight recently due to reduced physical activity.  She is trying to exercise at home and eating healthy but is difficult for her to lose weight.'  Her presentation is more consistent with meralgia paresthetica.  Advised patient on wearing loosefitting clothes.  Patient is not diabetic so her insurance will probably not cover for GLP-1 inhibitor.  -Referral to PT and PREP

## 2022-10-30 NOTE — Addendum Note (Signed)
Addended byGaylan Gerold on: 10/30/2022 11:56 AM   Modules accepted: Level of Service

## 2022-10-30 NOTE — Assessment & Plan Note (Signed)
Patient had ear aching last week after using a Bobby pin to scratch the inner canal.  She reports some bleeding with that and she has been applying Neosporin inside the canal.  She states that pain has improved now and does not bother her anymore.  No change in hearing.  Otoscope visualization showed some dried blood around tympanic membrane but no obvious signs of infection.  No pain with maneuver.  No fluid or purulent seen behind the tympanic membrane.  No perforation noted.    Advised patient to avoid using foreign object inside her ear.

## 2022-10-30 NOTE — Patient Instructions (Signed)
It was nice seeing you in the clinic today  1.  The swelling of your lower legs are likely to be venous insufficiency.  Please try to wear compression stockings and elevate your legs as much as possible.  2.  For your right thigh pain, this is likely meralgia paresthetica from compressing on your nerve.  Please try to wear loosefitting clothes.  3.  I refilled your Lyrica and placed a referral to physical therapy.  4.  I will recheck your A1c today.  Please return in 3 months, sooner if needed  Dr. Alfonse Spruce

## 2022-10-30 NOTE — Progress Notes (Signed)
CC: Bilateral lower leg swelling  HPI:  Alexa Rodriguez is a 67 y.o. living with hypertension, CAD, OSA, depression, chronic back pain who presents to the clinic for evaluation of her bilateral lower extremities swelling.  Please see problem based charting for detail  Past Medical History:  Diagnosis Date   ADHD 01/30/2020   Anxiety    Arthritis    Depression 01/30/2020   Elevated cholesterol    Fibromyalgia 01/30/2020   Healthcare maintenance 08/11/2020   Hypertension    Insomnia 10/21/2020   Murmur 09/23/2020   OSA (obstructive sleep apnea) 01/30/2020   Pneumonia    Stable angina 08/09/2020   Review of Systems:  per HPI  Physical Exam:  Vitals:   10/30/22 0933  BP: 139/61  Pulse: 72  Temp: 98.2 F (36.8 C)  TempSrc: Oral  SpO2: 100%  Weight: 189 lb 3.2 oz (85.8 kg)  Height: '5\' 2"'$  (1.575 m)   Physical Exam Constitutional:      General: She is not in acute distress.    Appearance: She is not ill-appearing.  HENT:     Head: Normocephalic.  Eyes:     General:        Right eye: No discharge.        Left eye: No discharge.     Conjunctiva/sclera: Conjunctivae normal.  Cardiovascular:     Rate and Rhythm: Normal rate and regular rhythm.  Pulmonary:     Effort: Pulmonary effort is normal. No respiratory distress.     Breath sounds: Normal breath sounds.  Musculoskeletal:     Comments: Trace to +1 edema of bilateral lower extremity, worse in the ankles.  No erythema.  Mild tenderness to palpation especially over the varicose vein.  Past right knee replacement scar noted.  Normal range of motion of bilateral knee and ankle joints  Skin:    General: Skin is warm.  Neurological:     Mental Status: She is alert.      Assessment & Plan:   See Encounters Tab for problem based charting.  Venous insufficiency Patient is here for evaluation of bilateral lower extremity swelling.  This is a chronic issue and has been going on for years.  She report  worsening swelling at the end of her day and better when she wakes up.  Reports mild tenderness of her calf and behind her knees bilaterally that is not worse with exertion or better with rest.  Patient denies any family history of hypercoagulable state, no personal history of cancer or immobilization.  She is not on any estrogen therapy.  Patient has chronic spinal stenosis underwent L4-L5 fusion in June 2023.  She also received epidural injection yesterday which helped with her bilateral leg pain.  Physical exam showed trace to +1 edema bilateral lower extremity, worse in the ankles.  There was no skin erythema.  She has mild tenderness to palpation of her lower legs especially with the varicose veins.  She has normal range of motion of bilateral knee and ankle joints.  Her exam and history are consistent with venous insufficiencies.  Low suspicion for DVT given the chronicity.  Advised patient on compression socks and leg elevation.   Right knee pain Patient reports improvement of her right knee pain but she still have persistent right lateral thigh burning sensation that is not improved after back surgery.  Nothing seems to trigger trigger the pain.  She said that Lyrica and duloxetine is helping tremendously.  She has not been taking Lyrica  consistently due to sedating effects.  She states she has gained weight recently due to reduced physical activity.  She is trying to exercise at home and eating healthy but is difficult for her to lose weight.'  Her presentation is more consistent with meralgia paresthetica.  Advised patient on wearing loosefitting clothes.  Patient is not diabetic so her insurance will probably not cover for GLP-1 inhibitor.  -Referral to PT and PREP  Ear pain, right Patient had ear aching last week after using a Bobby pin to scratch the inner canal.  She reports some bleeding with that and she has been applying Neosporin inside the canal.  She states that pain has improved  now and does not bother her anymore.  No change in hearing.  Otoscope visualization showed some dried blood around tympanic membrane but no obvious signs of infection.  No pain with maneuver.  No fluid or purulent seen behind the tympanic membrane.  No perforation noted.    Advised patient to avoid using foreign object inside her ear.   Patient discussed with Dr. Jimmye Norman

## 2022-11-05 DIAGNOSIS — I10 Essential (primary) hypertension: Secondary | ICD-10-CM | POA: Diagnosis not present

## 2022-11-05 DIAGNOSIS — E785 Hyperlipidemia, unspecified: Secondary | ICD-10-CM | POA: Diagnosis not present

## 2022-11-05 DIAGNOSIS — M797 Fibromyalgia: Secondary | ICD-10-CM | POA: Diagnosis not present

## 2022-11-05 DIAGNOSIS — I252 Old myocardial infarction: Secondary | ICD-10-CM | POA: Diagnosis not present

## 2022-11-05 DIAGNOSIS — G629 Polyneuropathy, unspecified: Secondary | ICD-10-CM | POA: Diagnosis not present

## 2022-11-05 DIAGNOSIS — E669 Obesity, unspecified: Secondary | ICD-10-CM | POA: Diagnosis not present

## 2022-11-05 DIAGNOSIS — I25119 Atherosclerotic heart disease of native coronary artery with unspecified angina pectoris: Secondary | ICD-10-CM | POA: Diagnosis not present

## 2022-11-05 DIAGNOSIS — G8929 Other chronic pain: Secondary | ICD-10-CM | POA: Diagnosis not present

## 2022-11-05 DIAGNOSIS — G4733 Obstructive sleep apnea (adult) (pediatric): Secondary | ICD-10-CM | POA: Diagnosis not present

## 2022-11-05 DIAGNOSIS — H259 Unspecified age-related cataract: Secondary | ICD-10-CM | POA: Diagnosis not present

## 2022-11-05 DIAGNOSIS — M5136 Other intervertebral disc degeneration, lumbar region: Secondary | ICD-10-CM | POA: Diagnosis not present

## 2022-11-05 DIAGNOSIS — M1990 Unspecified osteoarthritis, unspecified site: Secondary | ICD-10-CM | POA: Diagnosis not present

## 2022-11-05 NOTE — Progress Notes (Signed)
Internal Medicine Clinic Attending  Case and documentation of Dr. Nguyen  soon after the resident saw the patient reviewed.  I reviewed the AWV findings.  I agree with the assessment, diagnosis, and plan of care documented in the AWV note.     

## 2022-11-06 ENCOUNTER — Telehealth: Payer: Self-pay

## 2022-11-06 NOTE — Telephone Encounter (Signed)
Called re: PREP program referral, left voicemail to return my call at her convenience.

## 2022-11-09 ENCOUNTER — Telehealth: Payer: Self-pay

## 2022-11-09 NOTE — Telephone Encounter (Signed)
She called me Saturday evening, told her I would return her call to discuss PREP program referral on Monday (today); called and left voicemail

## 2022-11-10 ENCOUNTER — Other Ambulatory Visit: Payer: Self-pay | Admitting: Student

## 2022-11-10 NOTE — Telephone Encounter (Signed)
Next appt scheduled 6/10 with PCP. 

## 2022-11-12 IMAGING — RF DG LUMBAR SPINE 2-3V
1 series · 2 of 2 positions shown · non-contrast
Comparison: None Available.

CLINICAL DATA: L4-5 fusion

EXAM:
LUMBAR SPINE - 2-3 VIEW

[Series 1: dg x-ray · 0.14mm/px · 2 of 2 slices shown]
[im 1/2]
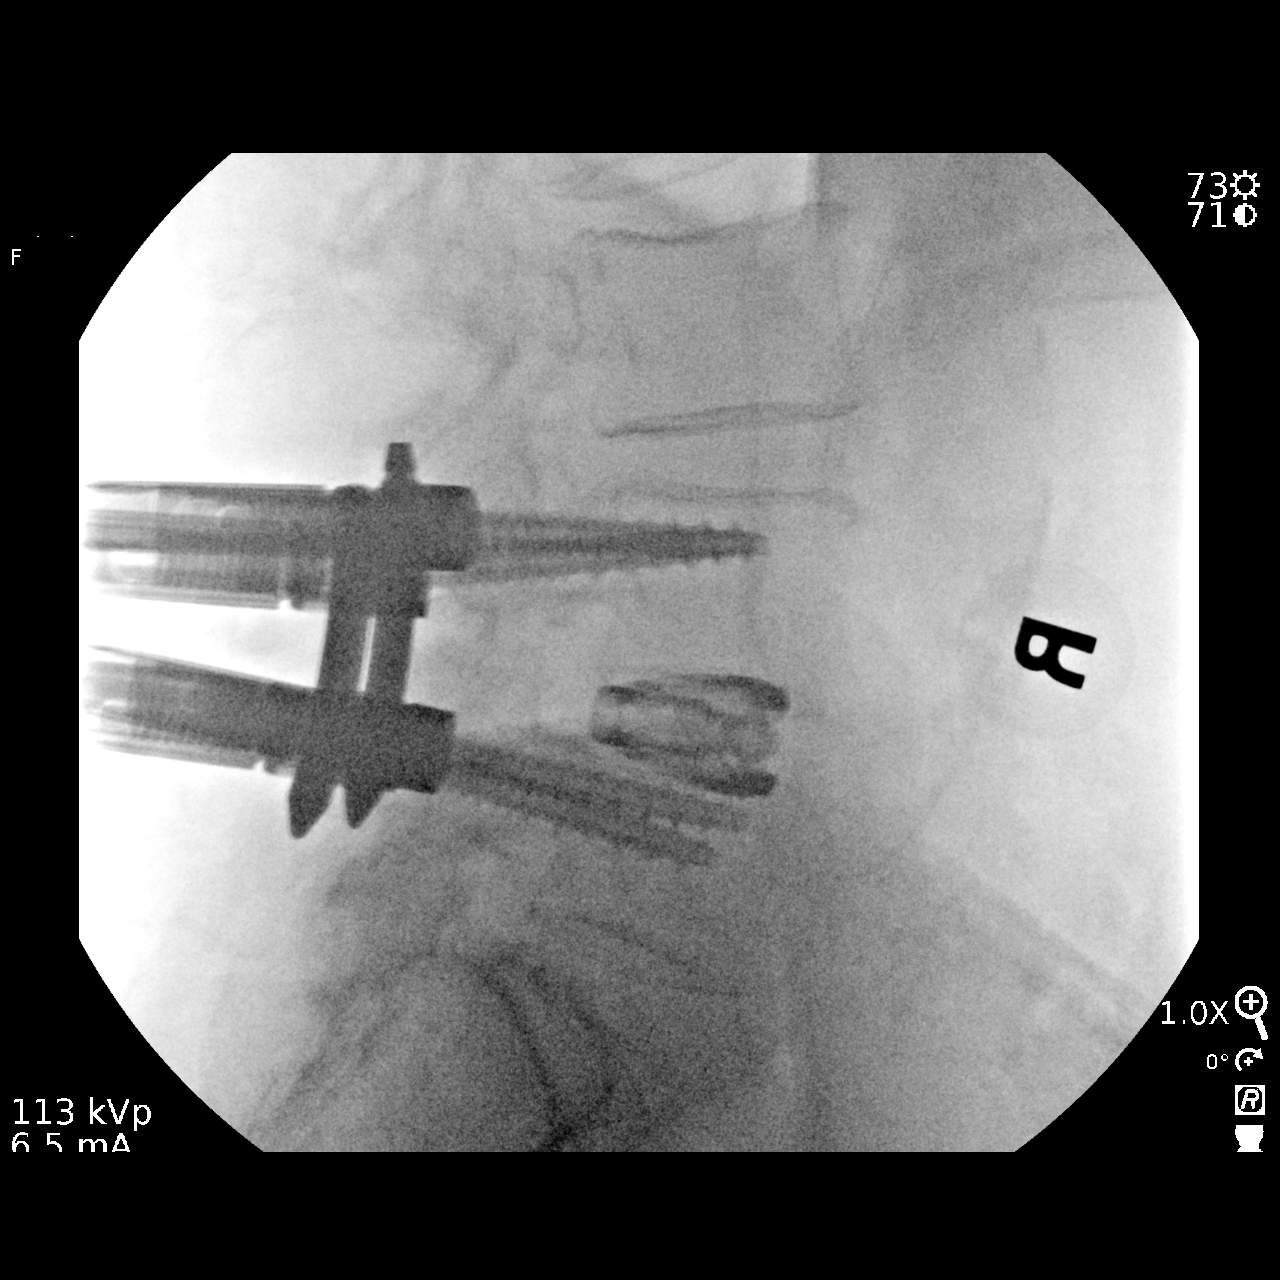
[im 2/2]
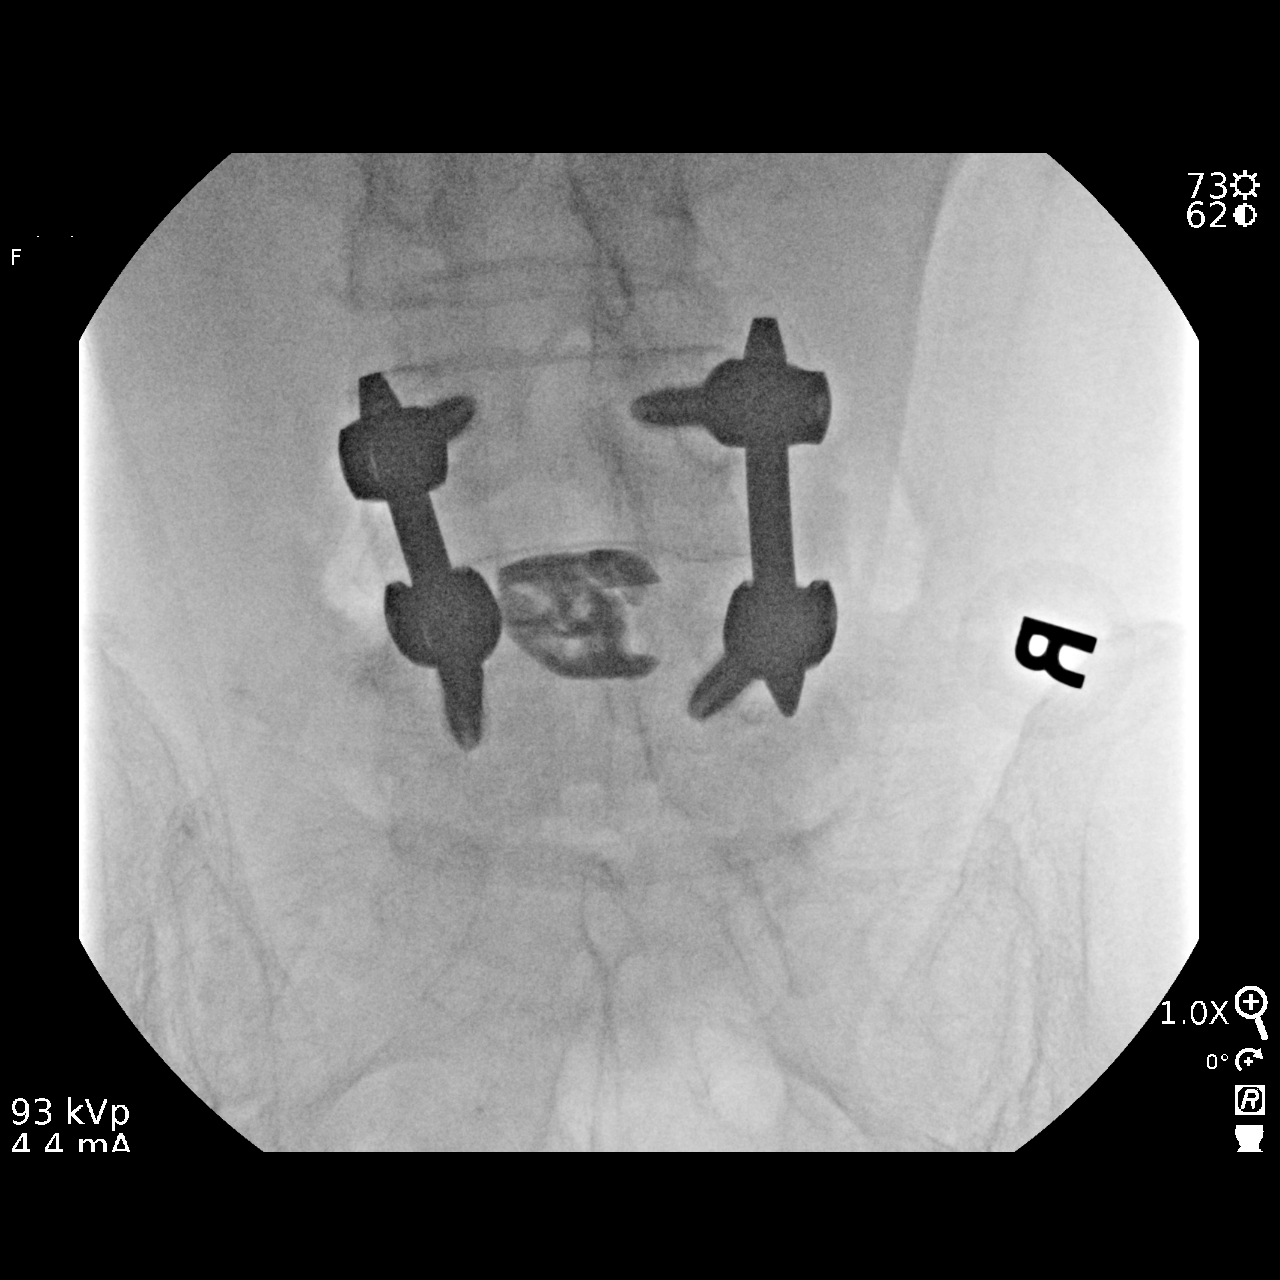

[2 of 2 positions shown; findings below may reference images not displayed]

FINDINGS: Multiple intraoperative fluoroscopic spot images are provided.
Interval posterior lumbar interbody fusion at L4-5.

FLUOROSCOPY TIME:  Radiation Exposure Index: 55.228 mGy
IMPRESSION: 1. Interval posterior lumbar interbody fusion at L4-5.

## 2022-11-14 NOTE — Progress Notes (Signed)
Internal Medicine Clinic Attending  Case discussed with Dr. Nguyen  At the time of the visit.  We reviewed the resident's history and exam and pertinent patient test results.  I agree with the assessment, diagnosis, and plan of care documented in the resident's note. 

## 2022-11-18 DIAGNOSIS — G4733 Obstructive sleep apnea (adult) (pediatric): Secondary | ICD-10-CM | POA: Diagnosis not present

## 2022-11-26 DIAGNOSIS — M5416 Radiculopathy, lumbar region: Secondary | ICD-10-CM | POA: Diagnosis not present

## 2022-12-09 DIAGNOSIS — S1086XA Insect bite of other specified part of neck, initial encounter: Secondary | ICD-10-CM | POA: Diagnosis not present

## 2022-12-09 DIAGNOSIS — R2231 Localized swelling, mass and lump, right upper limb: Secondary | ICD-10-CM | POA: Diagnosis not present

## 2022-12-09 DIAGNOSIS — R2232 Localized swelling, mass and lump, left upper limb: Secondary | ICD-10-CM | POA: Diagnosis not present

## 2022-12-09 DIAGNOSIS — R5383 Other fatigue: Secondary | ICD-10-CM | POA: Diagnosis not present

## 2022-12-15 ENCOUNTER — Other Ambulatory Visit: Payer: Self-pay | Admitting: Student

## 2022-12-15 DIAGNOSIS — M797 Fibromyalgia: Secondary | ICD-10-CM

## 2022-12-19 DIAGNOSIS — G4733 Obstructive sleep apnea (adult) (pediatric): Secondary | ICD-10-CM | POA: Diagnosis not present

## 2023-01-15 ENCOUNTER — Other Ambulatory Visit: Payer: Self-pay | Admitting: Physician Assistant

## 2023-01-15 DIAGNOSIS — M5416 Radiculopathy, lumbar region: Secondary | ICD-10-CM

## 2023-01-25 ENCOUNTER — Encounter: Payer: Self-pay | Admitting: *Deleted

## 2023-01-28 ENCOUNTER — Ambulatory Visit
Admission: RE | Admit: 2023-01-28 | Discharge: 2023-01-28 | Disposition: A | Discharge status: Home / Self Care | Payer: PPO | Source: Ambulatory Visit | Attending: Physician Assistant | Admitting: Physician Assistant

## 2023-01-28 DIAGNOSIS — M5416 Radiculopathy, lumbar region: Secondary | ICD-10-CM | POA: Diagnosis not present

## 2023-01-28 DIAGNOSIS — Z981 Arthrodesis status: Secondary | ICD-10-CM | POA: Diagnosis not present

## 2023-01-28 MED ORDER — DIAZEPAM 5 MG PO TABS: 5.0000 mg | ORAL_TABLET | Freq: Once | ORAL | Status: DC

## 2023-01-28 MED ORDER — MEPERIDINE HCL 50 MG/ML IJ SOLN: 50.0000 mg | Freq: Once | INTRAMUSCULAR | Status: DC | PRN

## 2023-01-28 MED ORDER — IOPAMIDOL (ISOVUE-M 200) INJECTION 41%
15.0000 mL | Freq: Once | INTRAMUSCULAR | Status: AC
  Administered 2023-01-28: 15 mL via INTRATHECAL

## 2023-01-28 MED ORDER — ONDANSETRON HCL 4 MG/2ML IJ SOLN: 4.0000 mg | Freq: Once | INTRAMUSCULAR | Status: DC | PRN

## 2023-01-28 NOTE — Discharge Instructions (Signed)

## 2023-02-01 ENCOUNTER — Encounter: Payer: PPO | Admitting: Student

## 2023-02-08 DIAGNOSIS — M5416 Radiculopathy, lumbar region: Secondary | ICD-10-CM | POA: Diagnosis not present

## 2023-02-12 ENCOUNTER — Inpatient Hospital Stay: Admission: RE | Admit: 2023-02-12 | Payer: PPO | Source: Ambulatory Visit

## 2023-03-10 DIAGNOSIS — G894 Chronic pain syndrome: Secondary | ICD-10-CM | POA: Diagnosis not present

## 2023-03-10 DIAGNOSIS — M5416 Radiculopathy, lumbar region: Secondary | ICD-10-CM | POA: Diagnosis not present

## 2023-03-10 DIAGNOSIS — Z9889 Other specified postprocedural states: Secondary | ICD-10-CM | POA: Diagnosis not present

## 2023-03-26 DIAGNOSIS — G4733 Obstructive sleep apnea (adult) (pediatric): Secondary | ICD-10-CM | POA: Diagnosis not present

## 2023-03-29 ENCOUNTER — Other Ambulatory Visit: Payer: Self-pay

## 2023-03-29 DIAGNOSIS — M797 Fibromyalgia: Secondary | ICD-10-CM

## 2023-04-01 MED ORDER — DULOXETINE HCL 60 MG PO CPEP: 60.0000 mg | ORAL_CAPSULE | Freq: Every day | ORAL | 0 refills | Status: DC

## 2023-04-01 NOTE — Telephone Encounter (Signed)
Please address.

## 2023-04-05 DIAGNOSIS — Z6833 Body mass index (BMI) 33.0-33.9, adult: Secondary | ICD-10-CM | POA: Diagnosis not present

## 2023-04-05 DIAGNOSIS — M5416 Radiculopathy, lumbar region: Secondary | ICD-10-CM | POA: Diagnosis not present

## 2023-04-05 DIAGNOSIS — Z9889 Other specified postprocedural states: Secondary | ICD-10-CM | POA: Diagnosis not present

## 2023-04-20 DIAGNOSIS — G4733 Obstructive sleep apnea (adult) (pediatric): Secondary | ICD-10-CM | POA: Diagnosis not present

## 2023-04-26 DIAGNOSIS — G4733 Obstructive sleep apnea (adult) (pediatric): Secondary | ICD-10-CM | POA: Diagnosis not present

## 2023-05-05 ENCOUNTER — Ambulatory Visit (INDEPENDENT_AMBULATORY_CARE_PROVIDER_SITE_OTHER): Payer: PPO | Admitting: Student

## 2023-05-05 ENCOUNTER — Encounter: Payer: Self-pay | Admitting: Student

## 2023-05-05 VITALS — BP 133/67 | HR 62 | Ht 62.0 in | Wt 189.6 lb

## 2023-05-05 DIAGNOSIS — M797 Fibromyalgia: Secondary | ICD-10-CM | POA: Diagnosis not present

## 2023-05-05 DIAGNOSIS — I872 Venous insufficiency (chronic) (peripheral): Secondary | ICD-10-CM | POA: Diagnosis not present

## 2023-05-05 DIAGNOSIS — G8929 Other chronic pain: Secondary | ICD-10-CM

## 2023-05-05 NOTE — Assessment & Plan Note (Signed)
Patient previously evaluated for venous insufficiency.  She says she is unable to use compression stockings because they cause her too much pain in her legs and she is unable to get them over her varicose veins.  She was previously evaluated by a vein specialist, but the details of that visit are unclear.  She has some dependent edema on exam today and reports leg achiness and swelling, that worsens when her legs are put to gravity.  She has mild tenderness to palpation diffusely in her ankles and lower legs, but this is more consistent with her fibromyalgia than localized due to the swelling.  No concerns for DVT at this time given presentation.  Plan - Referral to vascular surgery for evaluation of varicose veins and venous insufficiency - Recommended compression socks (since she cannot tolerate stockings), leg elevation

## 2023-05-05 NOTE — Progress Notes (Signed)
CC: back, leg pain  HPI:  [name] is a 67 y.o. female living with a history stated below and presents today for a follow up visit. Please see problem based assessment and plan for additional details.  Past Medical History:  Diagnosis Date   ADHD 01/30/2020   Anxiety    Arthritis    Depression 01/30/2020   Elevated cholesterol    Fibromyalgia 01/30/2020   Healthcare maintenance 08/11/2020   Hypertension    Insomnia 10/21/2020   Murmur 09/23/2020   OSA (obstructive sleep apnea) 01/30/2020   Pneumonia    Stable angina 08/09/2020    Current Outpatient Medications on File Prior to Visit  Medication Sig Dispense Refill   atorvastatin (LIPITOR) 10 MG tablet Take 10 mg by mouth daily.     carboxymethylcellulose (REFRESH PLUS) 0.5 % SOLN Place 1 drop into both eyes daily.     COLLAGEN PO Take 2 capsules by mouth daily.     DULoxetine (CYMBALTA) 60 MG capsule Take 1 capsule (60 mg total) by mouth daily. 60 capsule 0   fluorometholone (FML) 0.1 % ophthalmic ointment 1 application. 4 (four) times daily as needed (dry/itchy eyes). (Patient not taking: Reported on 04/22/2022)     methocarbamol (ROBAXIN) 750 MG tablet Take 1 tablet (750 mg total) by mouth every 6 (six) hours as needed for muscle spasms. 30 tablet 2   Multiple Vitamins-Minerals (MULTIVITAMIN WITH MINERALS) tablet Take 1 tablet by mouth daily. (Patient not taking: Reported on 04/22/2022)     nitroGLYCERIN (NITROSTAT) 0.4 MG SL tablet Place 1 tablet (0.4 mg total) under the tongue every 5 (five) minutes as needed for chest pain. 5 tablet 1   olmesartan (BENICAR) 20 MG tablet Take 1/2 (one-half) tablet by mouth once daily 45 tablet 0   OVER THE COUNTER MEDICATION Take 12 mg by mouth at bedtime. CBD 12 mg XITE with Delta 9 (9 mg)     OVER THE COUNTER MEDICATION 1 Package daily. Activate C - immune complex     Vitamin D-Vitamin K (K2 PLUS D3 PO) Take 1 tablet by mouth daily.     No current facility-administered  medications on file prior to visit.    Family History  Problem Relation Age of Onset   Clotting disorder Mother    Heart disease Mother    Atrial fibrillation Mother    Heart disease Father    Kidney disease Maternal Grandmother    Diabetes Maternal Grandmother    Heart disease Paternal Grandmother    Heart disease Paternal Grandfather    Colon cancer Neg Hx    Esophageal cancer Neg Hx    Pancreatic cancer Neg Hx    Stomach cancer Neg Hx     Social History   Socioeconomic History   Marital status: Married    Spouse name: Not on file   Number of children: Not on file   Years of education: Not on file   Highest education level: Not on file  Occupational History   Not on file  Tobacco Use   Smoking status: Former    Current packs/day: 0.00    Average packs/day: 1.5 packs/day for 20.0 years (30.0 ttl pk-yrs)    Types: Cigarettes    Start date: 08/24/1989    Quit date: 08/24/2009    Years since quitting: 13.7   Smokeless tobacco: Never  Vaping Use   Vaping status: Former  Substance and Sexual Activity   Alcohol use: Yes    Alcohol/week: 2.0 standard drinks of  alcohol    Types: 1 Glasses of wine, 1 Cans of beer per week    Comment: 3 times per week   Drug use: Not on file   Sexual activity: Not on file  Other Topics Concern   Not on file  Social History Narrative   Not on file   Social Determinants of Health   Financial Resource Strain: Low Risk  (10/30/2022)   Overall Financial Resource Strain (CARDIA)    Difficulty of Paying Living Expenses: Not very hard  Food Insecurity: Food Insecurity Present (10/30/2022)   Hunger Vital Sign    Worried About Running Out of Food in the Last Year: Sometimes true    Ran Out of Food in the Last Year: Sometimes true  Transportation Needs: No Transportation Needs (10/30/2022)   PRAPARE - Administrator, Civil Service (Medical): No    Lack of Transportation (Non-Medical): No  Physical Activity: Insufficiently Active  (10/30/2022)   Exercise Vital Sign    Days of Exercise per Week: 4 days    Minutes of Exercise per Session: 30 min  Stress: Stress Concern Present (10/30/2022)   [employer] of Occupational Health - Occupational Stress Questionnaire    Feeling of Stress : To some extent  Social Connections: Socially Integrated (10/30/2022)   Social Connection and Isolation Panel [NHANES]    Frequency of Communication with Friends and Family: More than three times a week    Frequency of Social Gatherings with Friends and Family: Twice a week    Attends Religious Services: More than 4 times per year    Active Member of [employer] or Organizations: Yes    Attends Engineer, structural: More than 4 times per year    Marital Status: Married  Catering manager Violence: Not At Risk (10/30/2022)   Humiliation, Afraid, Rape, and Kick questionnaire    Fear of Current or Ex-Partner: No    Emotionally Abused: No    Physically Abused: No    Sexually Abused: No   Review of Systems: ROS negative except for what is noted on the assessment and plan.  Vitals:   05/05/23 1558  BP: 133/67  Pulse: 62  SpO2: 98%  Weight: 189 lb 9.6 oz (86 kg)  Height: 5\' 2"  (1.575 m)    Physical Exam: Constitutional: tearful, in NAD Cardiovascular: regular rate and rhythm, no m/r/g Pulmonary/Chest: normal work of breathing on room air, lungs clear to auscultation bilaterally Abdominal: soft, non-tender, non-distended MSK: normal bulk and tone; tenderness to light palpation throughout lower extremities and back; reports most tenderness in lower back bilaterally Neurological: alert & oriented x 3, no focal deficits Skin: varicose veins noted in legs; 1+ dependent edema in bilateral LE Psych: tearful, frustrated  Assessment & Plan:   Patient seen with Dr. Heide Spark  Fibromyalgia Patient has a history of fibromyalgia.  She is currently taking Cymbalta 60 mg daily, Lyrica 100 mg twice daily, and Robaxin 750 milligrams twice a  day. She has also been taking Oxycodone-Acetaminophen, which is prescribed by Loma Newton, her pain specialist.  Of note, she had a spinal fusion of her lumbar L4-L5 back in 2023, and says she has been experiencing worsening of her fibromyalgia pain since then.  She describes the pain as diffuse throughout her legs, back, and fingertips.  She says that the pain radiates bilaterally down her legs on the lateral side and is accompanied by numbness and tingling.  She describes being unable to wear any tight clothing as the pain  worsens with pressure.  Despite her medication regimen, she feels like her fibromyalgia is poorly controlled and reports diffuse pain throughout. Per PDMP review, it looks like her lyrica was increased to 100 mg twice daily a few months ago. She states has been taking her Lyrica at night, because she says it makes her feel "out of it."  She says that she doesn't feel this way as long as she stays busy when she takes it. She may have better results with pain control by taking 1 dose of Lyrica in the daytime and 1 at night instead.  Plan - Referral to physical therapy - Recommended taking 1 dose of Lyrica in the morning and 1 dose at night; if her pain is not improved at follow-up visit, may consider increasing to Lyrica 125 mg twice a day - Follow-up in 4 weeks   Venous insufficiency Patient previously evaluated for venous insufficiency.  She says she is unable to use compression stockings because they cause her too much pain in her legs and she is unable to get them over her varicose veins.  She was previously evaluated by a vein specialist, but the details of that visit are unclear.  She has some dependent edema on exam today and reports leg achiness and swelling, that worsens when her legs are put to gravity.  She has mild tenderness to palpation diffusely in her ankles and lower legs, but this is more consistent with her fibromyalgia than localized due to the swelling.  No  concerns for DVT at this time given presentation.  Plan - Referral to vascular surgery for evaluation of varicose veins and venous insufficiency - Recommended compression socks (since she cannot tolerate stockings), leg elevation   Annett Fabian, MD  [facility], PGY-1 Phone: [phone] Date 05/05/2023 Time 5:11 PM

## 2023-05-05 NOTE — Assessment & Plan Note (Addendum)
Patient has a history of fibromyalgia.  She is currently taking Cymbalta 60 mg daily, Lyrica 100 mg twice daily, and Robaxin 750 milligrams twice a day. She has also been taking Oxycodone-Acetaminophen, which is prescribed by Loma Newton, her pain specialist.  Of note, she had a spinal fusion of her lumbar L4-L5 back in 2023, and says she has been experiencing worsening of her fibromyalgia pain since then.  She describes the pain as diffuse throughout her legs, back, and fingertips.  She says that the pain radiates bilaterally down her legs on the lateral side and is accompanied by numbness and tingling.  She describes being unable to wear any tight clothing as the pain worsens with pressure.  Despite her medication regimen, she feels like her fibromyalgia is poorly controlled and reports diffuse pain throughout. Per PDMP review, it looks like her lyrica was increased to 100 mg twice daily a few months ago. She states has been taking her Lyrica at night, because she says it makes her feel "out of it."  She says that she doesn't feel this way as long as she stays busy when she takes it. She may have better results with pain control by taking 1 dose of Lyrica in the daytime and 1 at night instead.  Plan - Referral to physical therapy - Recommended taking 1 dose of Lyrica in the morning and 1 dose at night; if her pain is not improved at follow-up visit, may consider increasing to Lyrica 125 mg twice a day - Follow-up in 4 weeks

## 2023-05-05 NOTE — Patient Instructions (Signed)
Thank you, Ms.Renatha Labruyere-May for allowing [place] to provide your care today. Today we discussed your back pain, fibromyalgia, and leg swelling.    For your pain, as we discussed, try taking your Lyrica once at night and once in the morning.  This will help manage your pain throughout the day.  We also recommend that you stay active throughout the day, since she said this helps you with the side effects of Lyrica that you were previously experiencing.  We have also put in a referral for physical therapy and they should be contacting you shortly.  For your leg swelling, we recommend trying some compression socks if you are able to.  We have put in a referral for vascular surgery to evaluate your varicose veins and venous insufficiency, and they should be contacting you shortly to schedule an appointment.  We will have a close follow-up appointment in 4 weeks to continue to monitor your symptoms.  Referrals ordered today:   Referral Orders         Ambulatory referral to Physical Therapy         Ambulatory referral to Vascular Surgery      Follow up: 4 weeks  We look forward to seeing you next time. Please call our clinic at [phone] if you have any questions or concerns. The best time to call is Monday-Friday from 9am-4pm, but there is someone available 24/7. If after hours or the weekend, call the main hospital number and ask for the Internal Medicine Resident On-Call. If you need medication refills, please notify your pharmacy one week in advance and they will send [place] a request.   Thank you for trusting me with your care. Wishing you the best!   Annett Fabian, MD [facility]

## 2023-05-10 NOTE — Progress Notes (Signed)
Internal Medicine Clinic Attending  I was physically present during the key portions of the resident provided service and participated in the medical decision making of patient's management care. I reviewed pertinent patient test results.  The assessment, diagnosis, and plan were formulated together and I agree with the documentation in the resident's note.  Earl Lagos, MD

## 2023-05-21 ENCOUNTER — Ambulatory Visit (HOSPITAL_COMMUNITY)
Admission: RE | Admit: 2023-05-21 | Discharge: 2023-05-21 | Disposition: A | Discharge status: Home / Self Care | Payer: PPO | Source: Ambulatory Visit | Attending: Vascular Surgery | Admitting: Vascular Surgery

## 2023-05-21 ENCOUNTER — Other Ambulatory Visit: Payer: Self-pay | Admitting: *Deleted

## 2023-05-21 ENCOUNTER — Ambulatory Visit (INDEPENDENT_AMBULATORY_CARE_PROVIDER_SITE_OTHER): Payer: PPO | Admitting: Vascular Surgery

## 2023-05-21 ENCOUNTER — Encounter: Payer: Self-pay | Admitting: Vascular Surgery

## 2023-05-21 VITALS — BP 142/85 | HR 58 | Temp 98.0°F | Ht 62.0 in | Wt 185.5 lb

## 2023-05-21 DIAGNOSIS — I872 Venous insufficiency (chronic) (peripheral): Secondary | ICD-10-CM | POA: Insufficient documentation

## 2023-05-21 NOTE — Progress Notes (Signed)
Patient ID: Alexa Rodriguez, female   DOB: [birth year], 67 y.o.   MRN: [id number]  Reason for Consult: No chief complaint on file.   Referred by Gust Rung, DO  Subjective:     HPI  Alexa Rodriguez is a 68 y.o. female who is presenting for lower extremity swelling and pain.  She reports she has been dealing with this for 4 to 5 years and had seen a vein specialist in the past and was offered a procedure but did not follow-up.  She reports significant swelling, worse at the end of the day.  She also has aching and itching in her varicosities specifically worse behind the knees.  She thinks her right leg may be slightly worse than her left.  She has been wearing knee-high compression stockings for the past 2 months.  She cannot tolerate thigh-high compression.  She has also been trying to intermittently elevate her legs as well.  She denies any history of ulceration.  Past Medical History:  Diagnosis Date   ADHD 01/30/2020   Anxiety    Arthritis    Depression 01/30/2020   Elevated cholesterol    Fibromyalgia 01/30/2020   Healthcare maintenance 08/11/2020   Hypertension    Insomnia 10/21/2020   Murmur 09/23/2020   OSA (obstructive sleep apnea) 01/30/2020   Pneumonia    Stable angina 08/09/2020   Family History  Problem Relation Age of Onset   Clotting disorder Mother    Heart disease Mother    Atrial fibrillation Mother    Heart disease Father    Kidney disease Maternal Grandmother    Diabetes Maternal Grandmother    Heart disease Paternal Grandmother    Heart disease Paternal Grandfather    Colon cancer Neg Hx    Esophageal cancer Neg Hx    Pancreatic cancer Neg Hx    Stomach cancer Neg Hx    Past Surgical History:  Procedure Laterality Date   BREAST LUMPECTOMY Left 1990   benign   CARDIAC CATHETERIZATION     2004 in [place]   JOINT REPLACEMENT     right knee   TONSILLECTOMY     TRANSFORAMINAL LUMBAR INTERBODY FUSION W/ MIS 1 LEVEL N/A 02/09/2022    Procedure: LUMBAR FOUR-FIVE MINIMALLY INVASIVE (MIS) TRANSFORAMINAL LUMBAR INTERBODY FUSION;  Surgeon: Jadene Pierini, MD;  Location: [facility] OR;  Service: Neurosurgery;  Laterality: N/A;   VAGINAL HYSTERECTOMY      Short Social History:  Social History   Tobacco Use   Smoking status: Former    Current packs/day: 0.00    Average packs/day: 1.5 packs/day for 20.0 years (30.0 ttl pk-yrs)    Types: Cigarettes    Start date: 08/24/1989    Quit date: 08/24/2009    Years since quitting: 13.7   Smokeless tobacco: Never  Substance Use Topics   Alcohol use: Yes    Alcohol/week: 2.0 standard drinks of alcohol    Types: 1 Glasses of wine, 1 Cans of beer per week    Comment: 3 times per week    Allergies  Allergen Reactions   Lopressor [Metoprolol Tartrate] Other (See Comments)    Heart Attacks    Cortisone Hypertension    Elevates B/P    Codeine Nausea And Vomiting   Latex Rash   Neurontin [Gabapentin] Anxiety and Other (See Comments)    Made pt feel angry, and caused anxiety     Current Outpatient Medications  Medication Sig Dispense Refill   atorvastatin (LIPITOR) 10 MG tablet Take 10  mg by mouth daily.     carboxymethylcellulose (REFRESH PLUS) 0.5 % SOLN Place 1 drop into both eyes daily.     COLLAGEN PO Take 2 capsules by mouth daily.     DULoxetine (CYMBALTA) 60 MG capsule Take 1 capsule (60 mg total) by mouth daily. 60 capsule 0   fluorometholone (FML) 0.1 % ophthalmic ointment 1 application  4 (four) times daily as needed (dry/itchy eyes).     methocarbamol (ROBAXIN) 750 MG tablet Take 1 tablet (750 mg total) by mouth every 6 (six) hours as needed for muscle spasms. 30 tablet 2   Multiple Vitamins-Minerals (MULTIVITAMIN WITH MINERALS) tablet Take 1 tablet by mouth daily.     nitroGLYCERIN (NITROSTAT) 0.4 MG SL tablet Place 1 tablet (0.4 mg total) under the tongue every 5 (five) minutes as needed for chest pain. 5 tablet 1   olmesartan (BENICAR) 20 MG tablet Take 1/2 (one-half)  tablet by mouth once daily 45 tablet 0   OVER THE COUNTER MEDICATION Take 12 mg by mouth at bedtime. CBD 12 mg XITE with Delta 9 (9 mg)     OVER THE COUNTER MEDICATION 1 Package daily. Activate C - immune complex     oxyCODONE-acetaminophen (PERCOCET/ROXICET) 5-325 MG tablet Take 1 tablet by mouth 2 (two) times daily as needed.     Vitamin D-Vitamin K (K2 PLUS D3 PO) Take 1 tablet by mouth daily.     No current facility-administered medications for this visit.    Review of Systems  All other systems reviewed and are negative       Objective:  Objective   Vitals:   05/21/23 1118  BP: (!) 142/85  Pulse: (!) 58  Temp: 98 F (36.7 C)  TempSrc: Temporal  SpO2: 95%  Weight: 185 lb 8 oz (84.1 kg)  Height: 5\' 2"  (1.575 m)   Body mass index is 33.93 kg/m.  Physical Exam General: no acute distress Cardiac: hemodynamically stable, nontachycardic Pulm: normal work of breathing Neuro: alert, no focal deficit Extremities: +2 edema bilaterally from ankles to mid thigh.  Reticular veins noted on bilateral shins.  Tender varicosities noted in the popliteal region as well as medial lower legs.  Venous congestion present in both ankles. Vascular: Palpable DPs bilaterally   Data: Right lower extremity venous reflux study Venous Reflux Times  +--------------+---------+------+-----------+------------+--------+  RIGHT        Reflux NoRefluxReflux TimeDiameter cmsComments                          Yes                                   +--------------+---------+------+-----------+------------+--------+  CFV                    yes   >1 second                       +--------------+---------+------+-----------+------------+--------+  FV mid        no                                              +--------------+---------+------+-----------+------------+--------+  Popliteal    no                                               +--------------+---------+------+-----------+------------+--------+  GSV at [facility]              yes    >500 ms      0.84              +--------------+---------+------+-----------+------------+--------+  GSV prox thigh          yes    >500 ms      0.40              +--------------+---------+------+-----------+------------+--------+  GSV mid thigh no                            0.43              +--------------+---------+------+-----------+------------+--------+  GSV dist thighno                            0.42              +--------------+---------+------+-----------+------------+--------+  GSV at knee   no                            0.40              +--------------+---------+------+-----------+------------+--------+  GSV prox calf no                                              +--------------+---------+------+-----------+------------+--------+  GSV mid calf            yes    >500 ms      0.36              +--------------+---------+------+-----------+------------+--------+  SSV Pop Fossa           yes    >500 ms      0.45              +--------------+---------+------+-----------+------------+--------+  SSV prox calf           yes    >500 ms      0.51              +--------------+---------+------+-----------+------------+--------+  SSV mid calf            yes    >500 ms      0.36              +--------------+---------+------+-----------+------------+--------+  AASV O        no                            0.40              +--------------+---------+------+-----------+------------+--------+  AASV P0.32    no                            0.32              +--------------+---------+------+-----------+------------+--------+  AASV M        no                                              +--------------+---------+------+-----------+------------+--------+  Assessment/Plan:     Alexa Rodriguez is a 67  y.o. female presenting with chronic venous insufficiency.  She has tried compression for the past 2 months but her symptoms continue to worsen.  She did see a vein clinic a few years ago and discussed a procedure but at that time did not want to proceed.  Now she thinks her symptoms have worsened and would like some relief. Encouraged continued compression stocking use and periodic elevation. Will plan for CVI follow-up with Dr. Myra Gianotti or Dr. Randie Heinz in 3 months with a left leg venous reflux study     Daria Pastures MD [facility]

## 2023-05-26 ENCOUNTER — Other Ambulatory Visit: Payer: Self-pay | Admitting: Internal Medicine

## 2023-05-26 DIAGNOSIS — M797 Fibromyalgia: Secondary | ICD-10-CM

## 2023-06-01 ENCOUNTER — Other Ambulatory Visit: Payer: Self-pay

## 2023-06-01 DIAGNOSIS — I872 Venous insufficiency (chronic) (peripheral): Secondary | ICD-10-CM

## 2023-06-22 DIAGNOSIS — G894 Chronic pain syndrome: Secondary | ICD-10-CM | POA: Diagnosis not present

## 2023-06-22 DIAGNOSIS — Z9889 Other specified postprocedural states: Secondary | ICD-10-CM | POA: Diagnosis not present

## 2023-07-16 ENCOUNTER — Other Ambulatory Visit: Payer: Self-pay | Admitting: Student

## 2023-07-16 DIAGNOSIS — G894 Chronic pain syndrome: Secondary | ICD-10-CM | POA: Diagnosis not present

## 2023-07-16 DIAGNOSIS — M797 Fibromyalgia: Secondary | ICD-10-CM

## 2023-07-16 DIAGNOSIS — M5386 Other specified dorsopathies, lumbar region: Secondary | ICD-10-CM | POA: Diagnosis not present

## 2023-07-16 DIAGNOSIS — M961 Postlaminectomy syndrome, not elsewhere classified: Secondary | ICD-10-CM | POA: Diagnosis not present

## 2023-07-19 ENCOUNTER — Other Ambulatory Visit: Payer: Self-pay

## 2023-07-19 MED ORDER — OLMESARTAN MEDOXOMIL 20 MG PO TABS: 20.0000 mg | ORAL_TABLET | Freq: Every day | ORAL | 0 refills | Status: DC

## 2023-07-19 NOTE — Telephone Encounter (Signed)
Medication sent to pharmacy  

## 2023-07-21 DIAGNOSIS — G4733 Obstructive sleep apnea (adult) (pediatric): Secondary | ICD-10-CM | POA: Diagnosis not present

## 2023-08-03 ENCOUNTER — Other Ambulatory Visit: Payer: Self-pay | Admitting: Student

## 2023-08-03 DIAGNOSIS — M797 Fibromyalgia: Secondary | ICD-10-CM

## 2023-08-10 DIAGNOSIS — G4733 Obstructive sleep apnea (adult) (pediatric): Secondary | ICD-10-CM | POA: Diagnosis not present

## 2023-08-20 DIAGNOSIS — G4733 Obstructive sleep apnea (adult) (pediatric): Secondary | ICD-10-CM | POA: Diagnosis not present

## 2023-08-25 DIAGNOSIS — G4733 Obstructive sleep apnea (adult) (pediatric): Secondary | ICD-10-CM | POA: Diagnosis not present

## 2023-08-30 ENCOUNTER — Ambulatory Visit (HOSPITAL_COMMUNITY): Payer: PPO

## 2023-08-30 ENCOUNTER — Ambulatory Visit: Payer: PPO | Admitting: Surgery

## 2023-09-01 DIAGNOSIS — Z9889 Other specified postprocedural states: Secondary | ICD-10-CM | POA: Diagnosis not present

## 2023-09-01 DIAGNOSIS — Z9689 Presence of other specified functional implants: Secondary | ICD-10-CM | POA: Diagnosis not present

## 2023-09-01 DIAGNOSIS — M5416 Radiculopathy, lumbar region: Secondary | ICD-10-CM | POA: Diagnosis not present

## 2023-09-08 DIAGNOSIS — H43811 Vitreous degeneration, right eye: Secondary | ICD-10-CM | POA: Diagnosis not present

## 2023-09-20 DIAGNOSIS — G4733 Obstructive sleep apnea (adult) (pediatric): Secondary | ICD-10-CM | POA: Diagnosis not present

## 2023-10-02 ENCOUNTER — Other Ambulatory Visit: Payer: Self-pay | Admitting: Student

## 2023-10-02 DIAGNOSIS — M797 Fibromyalgia: Secondary | ICD-10-CM

## 2023-10-04 ENCOUNTER — Encounter: Payer: PPO | Admitting: Student

## 2023-10-04 ENCOUNTER — Telehealth: Payer: Self-pay

## 2023-10-04 NOTE — Telephone Encounter (Signed)
 Medication sent to pharmacy

## 2023-10-04 NOTE — Telephone Encounter (Signed)
 Pt is requesting her ( LYRICA  )  but it is not on her med list ... She is requesting it be sent to the [facility] on [address]

## 2023-10-06 ENCOUNTER — Other Ambulatory Visit: Payer: Self-pay | Admitting: Student

## 2023-10-06 DIAGNOSIS — Z1231 Encounter for screening mammogram for malignant neoplasm of breast: Secondary | ICD-10-CM

## 2023-10-11 ENCOUNTER — Encounter: Payer: Self-pay | Admitting: Student

## 2023-10-11 ENCOUNTER — Ambulatory Visit: Payer: PPO | Admitting: Student

## 2023-10-11 VITALS — BP 133/76 | HR 56 | Temp 98.4°F | Ht 62.0 in | Wt 193.6 lb

## 2023-10-11 DIAGNOSIS — M797 Fibromyalgia: Secondary | ICD-10-CM | POA: Diagnosis not present

## 2023-10-11 DIAGNOSIS — R600 Localized edema: Secondary | ICD-10-CM | POA: Diagnosis not present

## 2023-10-11 DIAGNOSIS — I872 Venous insufficiency (chronic) (peripheral): Secondary | ICD-10-CM | POA: Diagnosis not present

## 2023-10-11 DIAGNOSIS — I10 Essential (primary) hypertension: Secondary | ICD-10-CM

## 2023-10-11 DIAGNOSIS — R32 Unspecified urinary incontinence: Secondary | ICD-10-CM

## 2023-10-11 DIAGNOSIS — N3942 Incontinence without sensory awareness: Secondary | ICD-10-CM

## 2023-10-11 MED ORDER — FUROSEMIDE 20 MG PO TABS: 20.0000 mg | ORAL_TABLET | Freq: Every day | ORAL | 11 refills | Status: AC

## 2023-10-11 NOTE — Assessment & Plan Note (Signed)
BP Readings from Last 3 Encounters:  10/11/23 133/76  05/21/23 (!) 142/85  05/05/23 133/67   History of hypertension.  Blood pressure is well-controlled today at 133/76.  She is taking olmesartan 10 mg daily with no concerns.  Since her blood pressure well-controlled, we will not make any medication changes at this time.

## 2023-10-11 NOTE — Assessment & Plan Note (Signed)
She has mild to moderate bilateral lower extremity edema, greater on the right than the left.  She says that her legs are painful as a result, but she also has fibromyalgia which may can be contributing to this focal pain.  A previous echocardiogram several years ago did note some chronic diastolic dysfunction, so I am wondering whether some of her lower extremity edema is related to her cardiac dysfunction.  It could also be related to her venous insufficiency.  We will trial her on Lasix 20 mg daily and have her come back in 2 weeks for reassessment.

## 2023-10-11 NOTE — Assessment & Plan Note (Signed)
She notes that she has been having urinary incontinence since her previous spinal surgery.  As a result, she has been wearing depends on a daily basis.  I have placed an order for a refill of these incontinence supplies.

## 2023-10-11 NOTE — Progress Notes (Signed)
CC: LE swelling  HPI:  [name] is a 68 y.o. female living with a history stated below and presents today for LE swelling. Please see problem based assessment and plan for additional details.  Past Medical History:  Diagnosis Date   ADHD 01/30/2020   Anxiety    Arthritis    Depression 01/30/2020   Elevated cholesterol    Fibromyalgia 01/30/2020   Healthcare maintenance 08/11/2020   Hypertension    Insomnia 10/21/2020   Murmur 09/23/2020   OSA (obstructive sleep apnea) 01/30/2020   Pneumonia    Stable angina (HCC) 08/09/2020    Current Outpatient Medications on File Prior to Visit  Medication Sig Dispense Refill   atorvastatin (LIPITOR) 10 MG tablet Take 10 mg by mouth daily.     carboxymethylcellulose (REFRESH PLUS) 0.5 % SOLN Place 1 drop into both eyes daily.     COLLAGEN PO Take 2 capsules by mouth daily.     DULoxetine (CYMBALTA) 60 MG capsule Take 1 capsule by mouth once daily 60 capsule 3   fluorometholone (FML) 0.1 % ophthalmic ointment 1 application  4 (four) times daily as needed (dry/itchy eyes).     methocarbamol (ROBAXIN) 750 MG tablet Take 1 tablet (750 mg total) by mouth every 6 (six) hours as needed for muscle spasms. 30 tablet 2   Multiple Vitamins-Minerals (MULTIVITAMIN WITH MINERALS) tablet Take 1 tablet by mouth daily.     nitroGLYCERIN (NITROSTAT) 0.4 MG SL tablet Place 1 tablet (0.4 mg total) under the tongue every 5 (five) minutes as needed for chest pain. 5 tablet 1   olmesartan (BENICAR) 20 MG tablet Take 1/2 (one-half) tablet by mouth once daily 45 tablet 3   OVER THE COUNTER MEDICATION Take 12 mg by mouth at bedtime. CBD 12 mg XITE with Delta 9 (9 mg)     OVER THE COUNTER MEDICATION 1 Package daily. Activate C - immune complex     oxyCODONE-acetaminophen (PERCOCET/ROXICET) 5-325 MG tablet Take 1 tablet by mouth 2 (two) times daily as needed.     Vitamin D-Vitamin K (K2 PLUS D3 PO) Take 1 tablet by mouth daily.     No current  facility-administered medications on file prior to visit.    Family History  Problem Relation Age of Onset   Clotting disorder Mother    Heart disease Mother    Atrial fibrillation Mother    Heart disease Father    Kidney disease Maternal Grandmother    Diabetes Maternal Grandmother    Heart disease Paternal Grandmother    Heart disease Paternal Grandfather    Colon cancer Neg Hx    Esophageal cancer Neg Hx    Pancreatic cancer Neg Hx    Stomach cancer Neg Hx     Social History   Socioeconomic History   Marital status: Married    Spouse name: Not on file   Number of children: Not on file   Years of education: Not on file   Highest education level: Not on file  Occupational History   Not on file  Tobacco Use   Smoking status: Former    Current packs/day: 0.00    Average packs/day: 1.5 packs/day for 20.0 years (30.0 ttl pk-yrs)    Types: Cigarettes    Start date: 08/24/1989    Quit date: 08/24/2009    Years since quitting: 14.1   Smokeless tobacco: Never  Vaping Use   Vaping status: Former  Substance and Sexual Activity   Alcohol use: Yes  Alcohol/week: 2.0 standard drinks of alcohol    Types: 1 Glasses of wine, 1 Cans of beer per week    Comment: 3 times per week   Drug use: Not on file   Sexual activity: Not on file  Other Topics Concern   Not on file  Social History Narrative   Not on file   Social Drivers of Health   Financial Resource Strain: Low Risk  (10/30/2022)   Overall Financial Resource Strain (CARDIA)    Difficulty of Paying Living Expenses: Not very hard  Food Insecurity: Food Insecurity Present (10/30/2022)   Hunger Vital Sign    Worried About Running Out of Food in the Last Year: Sometimes true    Ran Out of Food in the Last Year: Sometimes true  Transportation Needs: No Transportation Needs (10/30/2022)   PRAPARE - Administrator, Civil Service (Medical): No    Lack of Transportation (Non-Medical): No  Physical Activity:  Insufficiently Active (10/30/2022)   Exercise Vital Sign    Days of Exercise per Week: 4 days    Minutes of Exercise per Session: 30 min  Stress: Stress Concern Present (10/30/2022)   [employer] of Occupational Health - Occupational Stress Questionnaire    Feeling of Stress : To some extent  Social Connections: Socially Integrated (10/30/2022)   Social Connection and Isolation Panel [NHANES]    Frequency of Communication with Friends and Family: More than three times a week    Frequency of Social Gatherings with Friends and Family: Twice a week    Attends Religious Services: More than 4 times per year    Active Member of [employer] or Organizations: Yes    Attends Engineer, structural: More than 4 times per year    Marital Status: Married  Catering manager Violence: Not At Risk (10/30/2022)   Humiliation, Afraid, Rape, and Kick questionnaire    Fear of Current or Ex-Partner: No    Emotionally Abused: No    Physically Abused: No    Sexually Abused: No   Review of Systems: ROS negative except for what is noted on the assessment and plan.  Vitals:   10/11/23 1610  BP: 133/76  Pulse: (!) 56  Temp: 98.4 F (36.9 C)  TempSrc: Oral  SpO2: 98%  Weight: 193 lb 9.6 oz (87.8 kg)  Height: 5\' 2"  (1.575 m)   Physical Exam: Generally well-appearing woman Heart rate and rhythm regular, no murmurs; 1+ edema bilaterally in her lower extremities Lungs clear to auscultation bilaterally, regular respiratory rate and effort Diffuse tenderness to palpation throughout the physical exam  Assessment & Plan:   Patient discussed with Dr. Antony Contras  Primary hypertension BP Readings from Last 3 Encounters:  10/11/23 133/76  05/21/23 (!) 142/85  05/05/23 133/67   History of hypertension.  Blood pressure is well-controlled today at 133/76.  She is taking olmesartan 10 mg daily with no concerns.  Since her blood pressure well-controlled, we will not make any medication changes at this  time.  Venous insufficiency She states she has been using her compression stockings regularly.  She had an appointment with the venous specialists and says that they told her to continue using these for 3 months, which she has been doing.  She has a follow-up appointment with them in March.  Lower extremity edema She has mild to moderate bilateral lower extremity edema, greater on the right than the left.  She says that her legs are painful as a result, but she also has  fibromyalgia which may can be contributing to this focal pain.  A previous echocardiogram several years ago did note some chronic diastolic dysfunction, so I am wondering whether some of her lower extremity edema is related to her cardiac dysfunction.  It could also be related to her venous insufficiency.  We will trial her on Lasix 20 mg daily and have her come back in 2 weeks for reassessment.  Fibromyalgia He is currently seeing a pain management specialist for this.  Her medications include Cymbalta 60 mg daily, Lyrica 100 mg twice daily, and Robaxin 750 mg twice daily.  She says that she also recently had a spinal implant placed for pain, which has been helping her somewhat.  Overall, her pain seems better controlled than the last time I saw her last year.  We will defer management of her pain to her pain management specialists.  Urinary incontinence She notes that she has been having urinary incontinence since her previous spinal surgery.  As a result, she has been wearing depends on a daily basis.  I have placed an order for a refill of these incontinence supplies.  Annett Fabian, MD [facility] Internal Medicine, PGY-1 Phone: [phone] Date 10/11/2023 Time 5:13 PM

## 2023-10-11 NOTE — Patient Instructions (Signed)
Thank you, Alexa Rodriguez for allowing [place] to provide your care today. Today we discussed your swelling, fibromyalgia, and venous insufficiency.    As we discussed, I will have sent in a prescription for Lasix 20 mg, which you should take daily for the next 2 weeks.  We will schedule follow-up appointment in 2 weeks to reassess how your leg swelling has responded.  For your venous insufficiency, please keep wearing your compression stockings until your next follow-up appointment with the venous specialist.  For your fibromyalgia, you continue taking your medications as prescribed.  I am glad that you are able to get a spinal cord stimulator that is helping with your pain and are following with a pain management specialist to help you.  I have sent in a prescription for incontinence supplies, which should be mailed to your house.  I have ordered the following medication/changed the following medications:   Start the following medications: Meds ordered this encounter  Medications   furosemide (LASIX) 20 MG tablet    Sig: Take 1 tablet (20 mg total) by mouth daily.    Dispense:  30 tablet    Refill:  11     Follow up: 2 weeks  We look forward to seeing you next time. Please call our clinic at [phone] if you have any questions or concerns. The best time to call is Monday-Friday from 9am-4pm, but there is someone available 24/7. If after hours or the weekend, call the main hospital number and ask for the Internal Medicine Resident On-Call. If you need medication refills, please notify your pharmacy one week in advance and they will send [place] a request.   Thank you for trusting me with your care. Wishing you the best!   Annett Fabian, MD [facility]

## 2023-10-11 NOTE — Assessment & Plan Note (Signed)
He is currently seeing a pain management specialist for this.  Her medications include Cymbalta 60 mg daily, Lyrica 100 mg twice daily, and Robaxin 750 mg twice daily.  She says that she also recently had a spinal implant placed for pain, which has been helping her somewhat.  Overall, her pain seems better controlled than the last time I saw her last year.  We will defer management of her pain to her pain management specialists.

## 2023-10-11 NOTE — Assessment & Plan Note (Signed)
She states she has been using her compression stockings regularly.  She had an appointment with the venous specialists and says that they told her to continue using these for 3 months, which she has been doing.  She has a follow-up appointment with them in March.

## 2023-10-12 ENCOUNTER — Ambulatory Visit
Admission: RE | Admit: 2023-10-12 | Discharge: 2023-10-12 | Disposition: A | Discharge status: Home / Self Care | Payer: PPO | Source: Ambulatory Visit | Attending: Student in an Organized Health Care Education/Training Program

## 2023-10-12 DIAGNOSIS — Z1231 Encounter for screening mammogram for malignant neoplasm of breast: Secondary | ICD-10-CM

## 2023-10-14 NOTE — Progress Notes (Signed)
 Internal Medicine Clinic Attending  Case discussed with the resident at the time of the visit.  We reviewed the resident's history and exam and pertinent patient test results.  I agree with the assessment, diagnosis, and plan of care documented in the resident's note.

## 2023-10-21 DIAGNOSIS — G4733 Obstructive sleep apnea (adult) (pediatric): Secondary | ICD-10-CM | POA: Diagnosis not present

## 2023-10-25 ENCOUNTER — Encounter: Payer: Self-pay | Admitting: Student

## 2023-10-25 ENCOUNTER — Ambulatory Visit (INDEPENDENT_AMBULATORY_CARE_PROVIDER_SITE_OTHER): Payer: PPO | Admitting: Student

## 2023-10-25 VITALS — BP 121/65 | HR 87 | Temp 98.1°F | Ht 62.0 in | Wt 191.4 lb

## 2023-10-25 DIAGNOSIS — M79642 Pain in left hand: Secondary | ICD-10-CM | POA: Diagnosis not present

## 2023-10-25 DIAGNOSIS — R600 Localized edema: Secondary | ICD-10-CM

## 2023-10-25 DIAGNOSIS — M79641 Pain in right hand: Secondary | ICD-10-CM

## 2023-10-25 DIAGNOSIS — I10 Essential (primary) hypertension: Secondary | ICD-10-CM | POA: Diagnosis not present

## 2023-10-25 DIAGNOSIS — R7303 Prediabetes: Secondary | ICD-10-CM | POA: Diagnosis not present

## 2023-10-25 MED ORDER — DICLOFENAC SODIUM 1 % EX GEL: 4.0000 g | Freq: Four times a day (QID) | CUTANEOUS | 0 refills | Status: AC

## 2023-10-25 NOTE — Assessment & Plan Note (Addendum)
 She is here for reevaluation of her lower extremity edema.  She was started on a trial of Lasix 20 mg daily for the past 2 weeks, which she has been taking.  She has also been using compression stockings and elevating her legs as instructed.  Her leg swelling looks improved, but she still has some mild edema, which I suspect is from her venous insufficiency.  She has a follow-up appointment with vascular surgery on 3/17 for further workup of this.  I have recommended she take Lasix 20 mg every other day until her follow-up appointment vascular surgery.  After that appointment, I recommend she just take Lasix as needed when she feels like her legs are retaining some fluid.  We we will recheck a basic metabolic panel today.

## 2023-10-25 NOTE — Progress Notes (Signed)
    CC: Routine Follow Up for LE edema after last office visit 10/11/2023  HPI:  Alexa Rodriguez is a 68 y.o. female with pertinent PMH of fibromyalgia, LE edema, HTN, MDD who presents to the clinic for follow up of LE edema. Please see assessment and plan below for further details.  Review of Systems:   Pertinent items noted in HPI and/or A&P.  Physical Exam:  Vitals:   10/25/23 1035  BP: 121/65  Pulse: 87  Temp: 98.1 F (36.7 C)  TempSrc: Oral  SpO2: 98%  Weight: 191 lb 6.4 oz (86.8 kg)  Height: 5\' 2"  (1.575 m)   Well-appearing female in no acute distress Regular rate and rhythm, mild bilateral lower extremity edema, greater on the right than the left Venous stasis dermatitis present in bilateral lower extremities Bilateral hand stiffness, no focal tenderness to palpation, no apparent swelling or erythema in the hands; range of motion intact No new focal neurological deficits  Assessment & Plan:   Lower extremity edema She is here for reevaluation of her lower extremity edema.  She was started on a trial of Lasix 20 mg daily for the past 2 weeks, which she has been taking.  She has also been using compression stockings and elevating her legs as instructed.  Her leg swelling looks improved, but she still has some mild edema, which I suspect is from her venous insufficiency.  She has a follow-up appointment with vascular surgery on 3/17 for further workup of this.  I have recommended she take Lasix 20 mg every other day until her follow-up appointment vascular surgery.  After that appointment, I recommend she just take Lasix as needed when she feels like her legs are retaining some fluid.  We we will recheck a basic metabolic panel today.  Bilateral hand pain She reports bilateral hand pain, which seems to be acute on chronic.  She says that yesterday evening she had achy hand pain in all of the joints of both of her hands.  She took ibuprofen, which provided some relief.   This acute episode of hand pain has since resolved.  She is back to her baseline chronic bilateral hand pain, likely due to fibromyalgia and/or osteoarthritis.  She does not feel like she has been using her hands more frequently than usual.  On exam, her hands look grossly normal to appearance and I do not see any significant swelling or tenderness to palpation.  There is a mild abrasion in one of the interdigit areas on the left hand, but otherwise I do not appreciate any skin changes.  I have sent and Voltaren gel for her to use up to 4 times daily for her hand pain.  She may also continue to use ibuprofen or Tylenol arthritis strength as needed.   Patient discussed with Dr. Carleene Overlie, MD [facility] Internal Medicine Resident PGY-1 Clinic Phone: [phone] Pager: [phone]

## 2023-10-25 NOTE — Patient Instructions (Signed)
 Thank you, [name] for allowing [place] to provide your care today.  Your leg swelling looks little improved today after taking the Lasix.  You may continue taking the Lasix 20 mg every other day until your follow-up appointment with vascular surgery.  After that appointment, I recommend just taking the Lasix as needed when you feel like your legs are swollen more than usual.  Please also continue to use the compression stockings.  We will check a basic metabolic panel today to evaluate your electrolytes since starting the Lasix.  I will call you soon as we have the results.  I have ordered the following labs for you:   Lab Orders         BMP8+Anion Gap      I have ordered the following medication/changed the following medications:   Start the following medications: Meds ordered this encounter  Medications   diclofenac Sodium (VOLTAREN ARTHRITIS PAIN) 1 % GEL    Sig: Apply 4 g topically 4 (four) times daily.    Dispense:  100 g    Refill:  0     Follow up: 3 months   We look forward to seeing you next time. Please call our clinic at [phone] if you have any questions or concerns. The best time to call is Monday-Friday from 9am-4pm, but there is someone available 24/7. If after hours or the weekend, call the main hospital number and ask for the Internal Medicine Resident On-Call. If you need medication refills, please notify your pharmacy one week in advance and they will send [place] a request.   Thank you for trusting me with your care. Wishing you the best!   Annett Fabian, MD [facility]

## 2023-10-25 NOTE — Assessment & Plan Note (Signed)
 She reports bilateral hand pain, which seems to be acute on chronic.  She says that yesterday evening she had achy hand pain in all of the joints of both of her hands.  She took ibuprofen, which provided some relief.  This acute episode of hand pain has since resolved.  She is back to her baseline chronic bilateral hand pain, likely due to fibromyalgia and/or osteoarthritis.  She does not feel like she has been using her hands more frequently than usual.  On exam, her hands look grossly normal to appearance and I do not see any significant swelling or tenderness to palpation.  There is a mild abrasion in one of the interdigit areas on the left hand, but otherwise I do not appreciate any skin changes.  I have sent and Voltaren gel for her to use up to 4 times daily for her hand pain.  She may also continue to use ibuprofen or Tylenol arthritis strength as needed.

## 2023-10-25 NOTE — Progress Notes (Signed)
 Internal Medicine Clinic Attending  Case discussed with the resident at the time of the visit.  We reviewed the resident's history and exam and pertinent patient test results.  I agree with the assessment, diagnosis, and plan of care documented in the resident's note.

## 2023-10-26 LAB — BMP8+ANION GAP
Anion Gap: 16 mmol/L (ref 10.0–18.0)
BUN/Creatinine Ratio: 31 — ABNORMAL HIGH (ref 12–28)
BUN: 20 mg/dL (ref 8–27)
CO2: 22 mmol/L (ref 20–29)
Calcium: 9.7 mg/dL (ref 8.7–10.3)
Chloride: 108 mmol/L — ABNORMAL HIGH (ref 96–106)
Creatinine, Ser: 0.65 mg/dL (ref 0.57–1.00)
Glucose: 87 mg/dL (ref 70–99)
Potassium: 4.6 mmol/L (ref 3.5–5.2)
Sodium: 146 mmol/L — ABNORMAL HIGH (ref 134–144)
eGFR: 96 mL/min/{1.73_m2} (ref >59)

## 2023-10-27 ENCOUNTER — Encounter: Payer: Self-pay | Admitting: Student

## 2023-11-08 ENCOUNTER — Ambulatory Visit (HOSPITAL_COMMUNITY): Payer: PPO | Attending: Vascular Surgery

## 2023-11-08 ENCOUNTER — Ambulatory Visit: Payer: PPO | Admitting: Surgery

## 2023-11-08 DIAGNOSIS — G4733 Obstructive sleep apnea (adult) (pediatric): Secondary | ICD-10-CM | POA: Diagnosis not present

## 2023-11-11 ENCOUNTER — Ambulatory Visit (HOSPITAL_COMMUNITY)
Admission: RE | Admit: 2023-11-11 | Discharge: 2023-11-11 | Disposition: A | Discharge status: Home / Self Care | Source: Ambulatory Visit | Attending: Vascular Surgery | Admitting: Vascular Surgery

## 2023-11-11 ENCOUNTER — Encounter (HOSPITAL_COMMUNITY)

## 2023-11-11 DIAGNOSIS — I872 Venous insufficiency (chronic) (peripheral): Secondary | ICD-10-CM | POA: Diagnosis present

## 2023-11-18 DIAGNOSIS — G4733 Obstructive sleep apnea (adult) (pediatric): Secondary | ICD-10-CM | POA: Diagnosis not present

## 2023-12-13 ENCOUNTER — Telehealth: Payer: Self-pay | Admitting: *Deleted

## 2023-12-13 DIAGNOSIS — Z9689 Presence of other specified functional implants: Secondary | ICD-10-CM | POA: Diagnosis not present

## 2023-12-13 DIAGNOSIS — M5416 Radiculopathy, lumbar region: Secondary | ICD-10-CM | POA: Diagnosis not present

## 2023-12-13 DIAGNOSIS — G894 Chronic pain syndrome: Secondary | ICD-10-CM | POA: Diagnosis not present

## 2023-12-13 DIAGNOSIS — F1190 Opioid use, unspecified, uncomplicated: Secondary | ICD-10-CM | POA: Diagnosis not present

## 2023-12-13 NOTE — Telephone Encounter (Signed)
 Copied from CRM [id number]. Topic: Appointments - Scheduling Inquiry for Clinic >> Dec 13, 2023 11:04 AM Shelby Dessert H wrote: Reason for CRM: Patient called in today and she is wanting to know if we can see her for herpes flair up or does she needs to go to a different doctor like a GYN, patient is having pain in her ovaries, patient has been under a lot of stress, pain level 1-10 pain is 10, patient did find some creme that has helped but the pain will not go away, patient had a pain management appointment this morning, patients callback number is [phone].

## 2023-12-13 NOTE — Telephone Encounter (Signed)
 Called pt - no answer; left message on vm of office's return call.

## 2023-12-13 NOTE — Telephone Encounter (Signed)
 Called pt - no, left message of office's return call.

## 2023-12-13 NOTE — Telephone Encounter (Signed)
 Talked to pt -she does not have a GYN doctor. Pt would like med for herpes break-out and GYN referral - appt schedule with Dr Fabiola [facility] 4/23 @ 1015 Am.

## 2023-12-13 NOTE — Telephone Encounter (Signed)
 Patient is calling back to speak with a nurse. Patient missed her phone call earlier due to her phone being on silent and she did not realize it was on silent. Please call patient back  [phone].

## 2023-12-15 ENCOUNTER — Other Ambulatory Visit: Payer: Self-pay

## 2023-12-15 ENCOUNTER — Ambulatory Visit: Admitting: Student

## 2023-12-15 ENCOUNTER — Encounter: Payer: Self-pay | Admitting: Student

## 2023-12-15 VITALS — BP 125/63 | HR 80 | Temp 98.2°F | Ht 62.0 in | Wt 189.0 lb

## 2023-12-15 DIAGNOSIS — F32A Depression, unspecified: Secondary | ICD-10-CM | POA: Diagnosis not present

## 2023-12-15 DIAGNOSIS — A6009 Herpesviral infection of other urogenital tract: Secondary | ICD-10-CM | POA: Diagnosis not present

## 2023-12-15 DIAGNOSIS — Z636 Dependent relative needing care at home: Secondary | ICD-10-CM

## 2023-12-15 DIAGNOSIS — A6000 Herpesviral infection of urogenital system, unspecified: Secondary | ICD-10-CM

## 2023-12-15 DIAGNOSIS — W1800XA Striking against unspecified object with subsequent fall, initial encounter: Secondary | ICD-10-CM | POA: Diagnosis not present

## 2023-12-15 DIAGNOSIS — F329 Major depressive disorder, single episode, unspecified: Secondary | ICD-10-CM

## 2023-12-15 MED ORDER — VALACYCLOVIR HCL 500 MG PO TABS
500.0000 mg | ORAL_TABLET | Freq: Two times a day (BID) | ORAL | 0 refills | Status: AC
Start: 2023-12-15 — End: 2023-12-18

## 2023-12-15 NOTE — Assessment & Plan Note (Signed)
 Patient fell on her way to the bathroom about 3-4 days ago. Did not hit her head. No rugs in the house/bathroom. No dizziness or difficulty with ambulation. Reports she may have been tired that day and her methocarbamol  makes her extra sleepy at night. Of note, patient lives with fibromyalgia and complex pain and is on multiple centrally acting medications. She volunteered trying her nerve stimulator some more vs reaching for methocarbamol , which I think it is appropriate and safe. She uses methocarbamol  for spasms. She will discuss this with her pain specialist.  - CTM at outpatient visits - high risk for falls given polypharmacy

## 2023-12-15 NOTE — Progress Notes (Signed)
 Subjective:  CC: herpes reactivation  HPI:  Alexa Rodriguez is a 68 y.o. female with a past medical history stated below and presents today for herpes genitalis treatment. However, during this interview patient was also screened for caregiver stress and found to be sad and anxious. She also discloed she had a fall in her bathroom, without hitting her head, and now has a bruise over her R arm. Please see problem based assessment and plan for additional details.  Past Medical History:  Diagnosis Date   ADHD 01/30/2020   Anxiety    Arthritis    Depression 01/30/2020   Elevated cholesterol    Fibromyalgia 01/30/2020   Healthcare maintenance 08/11/2020   Hypertension    Insomnia 10/21/2020   Murmur 09/23/2020   OSA (obstructive sleep apnea) 01/30/2020   Pneumonia    Stable angina (HCC) 08/09/2020    Current Outpatient Medications on File Prior to Visit  Medication Sig Dispense Refill   atorvastatin  (LIPITOR) 10 MG tablet Take 10 mg by mouth daily.     carboxymethylcellulose (REFRESH PLUS) 0.5 % SOLN Place 1 drop into both eyes daily.     COLLAGEN PO Take 2 capsules by mouth daily.     diclofenac  Sodium (VOLTAREN  ARTHRITIS PAIN) 1 % GEL Apply 4 g topically 4 (four) times daily. 100 g 0   DULoxetine  (CYMBALTA ) 60 MG capsule Take 1 capsule by mouth once daily 60 capsule 3   fluorometholone  (FML) 0.1 % ophthalmic ointment 1 application  4 (four) times daily as needed (dry/itchy eyes).     furosemide  (LASIX ) 20 MG tablet Take 1 tablet (20 mg total) by mouth daily. 30 tablet 11   methocarbamol  (ROBAXIN ) 750 MG tablet Take 1 tablet (750 mg total) by mouth every 6 (six) hours as needed for muscle spasms. 30 tablet 2   Multiple Vitamins-Minerals (MULTIVITAMIN WITH MINERALS) tablet Take 1 tablet by mouth daily.     nitroGLYCERIN  (NITROSTAT ) 0.4 MG SL tablet Place 1 tablet (0.4 mg total) under the tongue every 5 (five) minutes as needed for chest pain. 5 tablet 1   olmesartan   (BENICAR ) 20 MG tablet Take 1/2 (one-half) tablet by mouth once daily 45 tablet 3   OVER THE COUNTER MEDICATION Take 12 mg by mouth at bedtime. CBD 12 mg XITE with Delta 9 (9 mg)     OVER THE COUNTER MEDICATION 1 Package daily. Activate C - immune complex     oxyCODONE -acetaminophen  (PERCOCET/ROXICET) 5-325 MG tablet Take 1 tablet by mouth 2 (two) times daily as needed.     Vitamin D-Vitamin K (K2 PLUS D3 PO) Take 1 tablet by mouth daily.     No current facility-administered medications on file prior to visit.    Family History  Problem Relation Age of Onset   Clotting disorder Mother    Heart disease Mother    Atrial fibrillation Mother    Heart disease Father    Kidney disease Maternal Grandmother    Diabetes Maternal Grandmother    Heart disease Paternal Grandmother    Heart disease Paternal Grandfather    Colon cancer Neg Hx    Esophageal cancer Neg Hx    Pancreatic cancer Neg Hx    Stomach cancer Neg Hx    Breast cancer Neg Hx     Social History   Socioeconomic History   Marital status: Married    Spouse name: Not on file   Number of children: Not on file   Years of education: Not on file  Highest education level: Not on file  Occupational History   Not on file  Tobacco Use   Smoking status: Former    Current packs/day: 0.00    Average packs/day: 1.5 packs/day for 20.0 years (30.0 ttl pk-yrs)    Types: Cigarettes    Start date: 08/24/1989    Quit date: 08/24/2009    Years since quitting: 14.3   Smokeless tobacco: Never  Vaping Use   Vaping status: Former  Substance and Sexual Activity   Alcohol  use: Yes    Alcohol /week: 2.0 standard drinks of alcohol     Types: 1 Glasses of wine, 1 Cans of beer per week    Comment: 3 times per week   Drug use: Not on file   Sexual activity: Not on file  Other Topics Concern   Not on file  Social History Narrative   Not on file   Social Drivers of Health   Financial Resource Strain: Low Risk  (10/30/2022)   Overall  Financial Resource Strain (CARDIA)    Difficulty of Paying Living Expenses: Not very hard  Food Insecurity: Food Insecurity Present (10/30/2022)   Hunger Vital Sign    Worried About Running Out of Food in the Last Year: Sometimes true    Ran Out of Food in the Last Year: Sometimes true  Transportation Needs: No Transportation Needs (10/30/2022)   PRAPARE - Administrator, Civil Service (Medical): No    Lack of Transportation (Non-Medical): No  Physical Activity: Insufficiently Active (10/30/2022)   Exercise Vital Sign    Days of Exercise per Week: 4 days    Minutes of Exercise per Session: 30 min  Stress: Stress Concern Present (10/30/2022)   [employer] of Occupational Health - Occupational Stress Questionnaire    Feeling of Stress : To some extent  Social Connections: Socially Integrated (10/30/2022)   Social Connection and Isolation Panel [NHANES]    Frequency of Communication with Friends and Family: More than three times a week    Frequency of Social Gatherings with Friends and Family: Twice a week    Attends Religious Services: More than 4 times per year    Active Member of [employer] or Organizations: Yes    Attends Engineer, structural: More than 4 times per year    Marital Status: Married  Catering manager Violence: Not At Risk (10/30/2022)   Humiliation, Afraid, Rape, and Kick questionnaire    Fear of Current or Ex-Partner: No    Emotionally Abused: No    Physically Abused: No    Sexually Abused: No    Review of Systems: ROS negative except for what is noted on the assessment and plan.  Objective:   Vitals:   12/15/23 1026  BP: 125/63  Pulse: 80  Temp: 98.2 F (36.8 C)  TempSrc: Oral  SpO2: 99%  Weight: 189 lb (85.7 kg)  Height: 5\' 2"  (1.575 m)    Physical Exam: Constitutional: well-appearing woman sitting in chair, tearful HENT: normocephalic atraumatic, mucous membranes moist Eyes: conjunctiva non-erythematous Neck: supple Cardiovascular:  regular rate and rhythm, no m/r/g, bilateral radial pulses intact Pulmonary/Chest: normal work of breathing on room air, lungs clear to auscultation bilaterally Abdominal: soft, non-tender, non-distended MSK: normal bulk and tone. Resolving ecchymosis over there R anterior arm. Mild tenderness to palpation. Normal passive and active shoulder range of motion. Normal distal BL hand range of motion and strength. Neurological: alert & oriented x 3, 5/5 strength in bilateral upper and lower extremities, normal gait.  Skin:  warm and dry Psych: Sad mood and affect       12/15/2023   10:33 AM  Depression screen PHQ 2/9  Decreased Interest 0  Down, Depressed, Hopeless 3  PHQ - 2 Score 3  Altered sleeping 3  Tired, decreased energy 2  Change in appetite 1  Feeling bad or failure about yourself  0  Trouble concentrating 3  Moving slowly or fidgety/restless 3  Suicidal thoughts 0  PHQ-9 Score 15  Difficult doing work/chores Not difficult at all       12/15/2023   10:35 AM  GAD 7 : Generalized Anxiety Score  Nervous, Anxious, on Edge 3  Control/stop worrying 0  Worry too much - different things 3  Trouble relaxing 3  Restless 0  Easily annoyed or irritable 3  Afraid - awful might happen 0  Total GAD 7 Score 12  Anxiety Difficulty Somewhat difficult     Assessment & Plan:   Depression Patient screened for worsening depression and anxiety scores. She has been caring for husband, who was recently diagnosed with [facility] and is undergoing chemotherapy. Denies SI/HI. Has support from sister and niece, who have moved in to help with care of patient's husband. Patient is able to sleep when she has time to, but shares with me that she has to monitor her husband often (has baby monitor with her in the room). She has been adherent to Duloxetine ; no recent panic attacks. Has been worried about the progression of this disease and what comes for them in the future.   I am hesitant to start patient on a  new medication at this time. It seems like her symptoms are secondary to caregiver stress and nee significant change for her husband, who was previously healthy. At this time, discussed with patient referral for psychotherapy and further support to which she is amenable - Urgent referral to [facility] for social work and caregiver stress support - Return to clinic in ~3 months for continued monitoring; may need medication support if pervasive, persistent.  Herpes genitalis in women Reactivation of virus in the past two days with painful, pruritic vesicles on the labia and perianal area.  - Valtrex  500 mg BID x 3 days  Fall against object Patient fell on her way to the bathroom about 3-4 days ago. Did not hit her head. No rugs in the house/bathroom. No dizziness or difficulty with ambulation. Reports she may have been tired that day and her methocarbamol  makes her extra sleepy at night. Of note, patient lives with fibromyalgia and complex pain and is on multiple centrally acting medications. She volunteered trying her nerve stimulator some more vs reaching for methocarbamol , which I think it is appropriate and safe. She uses methocarbamol  for spasms. She will discuss this with her pain specialist.  - CTM at outpatient visits - high risk for falls given polypharmacy    Return in about 3 months (around 03/15/2024) for Depression follow up, medication management.  Patient discussed with Dr. Macario Savin, MD [facility] Medicine Residency Program  12/15/2023, 1:41 PM

## 2023-12-15 NOTE — Assessment & Plan Note (Signed)
 Reactivation of virus in the past two days with painful, pruritic vesicles on the labia and perianal area.  - Valtrex  500 mg BID x 3 days

## 2023-12-15 NOTE — Assessment & Plan Note (Signed)
 Patient screened for worsening depression and anxiety scores. She has been caring for husband, who was recently diagnosed with [facility] and is undergoing chemotherapy. Denies SI/HI. Has support from sister and niece, who have moved in to help with care of patient's husband. Patient is able to sleep when she has time to, but shares with me that she has to monitor her husband often (has baby monitor with her in the room). She has been adherent to Duloxetine ; no recent panic attacks. Has been worried about the progression of this disease and what comes for them in the future.   I am hesitant to start patient on a new medication at this time. It seems like her symptoms are secondary to caregiver stress and nee significant change for her husband, who was previously healthy. At this time, discussed with patient referral for psychotherapy and further support to which she is amenable - Urgent referral to [facility] for social work and caregiver stress support - Return to clinic in ~3 months for continued monitoring; may need medication support if pervasive, persistent.

## 2023-12-15 NOTE — Patient Instructions (Signed)
 Thank you, Ms.Annaliesa Labruyere-May for allowing us  to provide your care today. Today we discussed your rash, your husbands disease, your current emotional state, and your recent fall.    Referrals: - social work - this will be important in this new transition to help you find coping mechanisms during this difficult time. Please return to us  if  you notice your symptoms are persistent as you may need additional medications. For now, please schedule your online apointment with the social worker. You should expect a phone call thin a week.  New medications: - valtrex  - take a pill twice per day for 3 days - Call us  if your rash does not improve  Fall precautions - Consider decreasing methocarbamol  and using your stimulator. Monitor your environment and medication reactions as this could make contribute to somnolence and fall risk.  My Chart Access: [email or web address]  Please follow-up in: 3 months    We look forward to seeing you next time. Please call our clinic at [phone] if you have any questions or concerns. The best time to call is Monday-Friday from 9am-4pm, but there is someone available 24/7. If after hours or the weekend, call the main hospital number and ask for the Internal Medicine Resident On-Call. If you need medication refills, please notify your pharmacy one week in advance and they will send us  a request.   Thank you for letting us  take part in your care. Wishing you the best!  Cathey Clunes, MD 12/15/2023, 11:05 AM [facility]

## 2023-12-16 ENCOUNTER — Telehealth: Payer: Self-pay | Admitting: *Deleted

## 2023-12-16 NOTE — Progress Notes (Signed)
 Complex Care Management Note Care Guide Note  12/16/2023 Name: Indiya Izquierdo MRN: [id number] DOB: July 30, 1956   Complex Care Management Outreach Attempts: An unsuccessful telephone outreach was attempted today to offer the patient information about available complex care management services.  Follow Up Plan:  Additional outreach attempts will be made to offer the patient complex care management information and services.   Encounter Outcome:  No Answer  Barnie Bora  [facility]  [facility], [facility] Guide  Direct Dial: [phone]  Fax [phone]

## 2023-12-19 DIAGNOSIS — G4733 Obstructive sleep apnea (adult) (pediatric): Secondary | ICD-10-CM | POA: Diagnosis not present

## 2023-12-20 NOTE — Progress Notes (Addendum)
 Complex Care Management Note  Care Guide Note 12/20/2023 Name: Elina Blase MRN: [id number] DOB: 29-Apr-1956  Bria Veeneman is a 68 y.o. year old female who sees Dorthy Gavia, MD for primary care. I reached out to Charliene Labruyere-May by phone today to offer complex care management services.  Ms. Lyden was given information about Complex Care Management services today including:   The Complex Care Management services include support from the care team which includes your Nurse Care Manager, Clinical Social Worker, or Pharmacist.  The Complex Care Management team is here to help remove barriers to the health concerns and goals most important to you. Complex Care Management services are voluntary, and the patient may decline or stop services at any time by request to their care team member.   Complex Care Management Consent Status: Patient agreed to services and verbal consent obtained.   Follow up plan:  Telephone appointment with complex care management team member scheduled for:  12/28/23  Encounter Outcome:  Patient Scheduled  Barnie Bora  [facility]  [facility], [facility] Guide  Direct Dial: [phone]  Fax [phone]

## 2023-12-20 NOTE — Progress Notes (Signed)
 Complex Care Management Note Care Guide Note  12/20/2023 Name: Alexa Rodriguez MRN: [id number] DOB: [birth year]   Complex Care Management Outreach Attempts: A second unsuccessful outreach was attempted today to offer the patient with information about available complex care management services.  Follow Up Plan:  Additional outreach attempts will be made to offer the patient complex care management information and services.   Encounter Outcome:  No Answer  Barnie Bora  [facility]  [facility], [facility], [facility] Guide  Direct Dial: [phone]  Fax [phone]

## 2023-12-21 ENCOUNTER — Other Ambulatory Visit: Payer: Self-pay | Admitting: Student

## 2023-12-21 DIAGNOSIS — A6000 Herpesviral infection of urogenital system, unspecified: Secondary | ICD-10-CM

## 2023-12-22 NOTE — Telephone Encounter (Signed)
 Per last [facility] note, only a short 3 day course.

## 2023-12-24 NOTE — Progress Notes (Signed)
 Internal Medicine Clinic Attending  Case discussed with the resident at the time of the visit.  We reviewed the resident's history and exam and pertinent patient test results.  I agree with the assessment, diagnosis, and plan of care documented in the resident's note.

## 2023-12-28 ENCOUNTER — Telehealth: Payer: Self-pay | Admitting: Licensed Clinical Social Worker

## 2023-12-31 ENCOUNTER — Ambulatory Visit: Payer: Self-pay

## 2023-12-31 NOTE — Telephone Encounter (Signed)
 Patient is calling to report a herpes flare-up. Per chart, patient was seen for an OV for symptoms on 12/15/23. Patient was prescribed valtrex  and lidocaine . Patient stated symptoms have not changed since OV and medication is not helping. Patient is requesting a different medication. Please advise.   Copied from CRM [id number]. Topic: Clinical - Prescription Issue >> Dec 31, 2023 12:04 PM Tisa Forester wrote: Reason for CRM: patient called on 12/31/23 stated the medication valterex - for herpes flare up the issue patient is having  the medication is not working at all Reason for Disposition  Prescription request for new medicine (not a refill)  Protocols used: Medication Refill and Renewal Call-A-AH

## 2024-01-04 ENCOUNTER — Encounter: Payer: Self-pay | Admitting: Licensed Clinical Social Worker

## 2024-01-04 ENCOUNTER — Other Ambulatory Visit: Payer: Self-pay | Admitting: Internal Medicine

## 2024-01-04 ENCOUNTER — Telehealth: Payer: Self-pay | Admitting: Licensed Clinical Social Worker

## 2024-01-04 ENCOUNTER — Telehealth: Admitting: Licensed Clinical Social Worker

## 2024-01-04 NOTE — Telephone Encounter (Signed)
 RTC to patient.  Message left that Clinics has been calling to find out what has worked for her in the past with her present problem.

## 2024-01-05 ENCOUNTER — Other Ambulatory Visit: Payer: Self-pay | Admitting: Student

## 2024-01-05 DIAGNOSIS — B009 Herpesviral infection, unspecified: Secondary | ICD-10-CM

## 2024-01-05 MED ORDER — VALACYCLOVIR HCL 500 MG PO TABS
500.0000 mg | ORAL_TABLET | Freq: Two times a day (BID) | ORAL | 0 refills | Status: AC
Start: 2024-01-05 — End: 2024-01-08

## 2024-01-05 NOTE — Telephone Encounter (Signed)
 RTC from patient states has only been on the Valtrex  for 3 days and worked temporarily.  Lidicaine did not help at all.Would like something for a longer period of time if possible. Needs something as soon as possible sent to the [facility] in [place].

## 2024-01-05 NOTE — Progress Notes (Signed)
 PT called clinic stating that valtrex  helped her only for a brief time for her HSV. Sending additional valtrex  now. Attempted to call pt with no success. Rx for Valtrex  500mg  BID x3 days sent to [facility] in [place].

## 2024-01-10 ENCOUNTER — Telehealth: Payer: Self-pay | Admitting: *Deleted

## 2024-01-10 NOTE — Progress Notes (Signed)
 Complex Care Management Care Guide Note  01/10/2024 Name: Alexa Rodriguez MRN: [id number] DOB: 1956-02-11  Alexa Rodriguez is a 68 y.o. year old female who is a primary care patient of Dorthy Gavia, MD and is actively engaged with the care management team. I reached out to Alexa Rodriguez by phone today to assist with re-scheduling  with the Licensed Clinical Child psychotherapist.  Follow up plan: Unsuccessful telephone outreach attempt made. A HIPAA compliant phone message was left for the patient providing contact information and requesting a return call.  Barnie Bora  [facility]  [facility], [facility] Guide  Direct Dial: [phone]  Fax [phone]

## 2024-01-12 NOTE — Progress Notes (Signed)
 Complex Care Management Care Guide Note  01/12/2024 Name: [name] MRN: [id number] DOB: Dec 06, 1955  Alexa Rodriguez is a 68 y.o. year old female who is a primary care patient of Dorthy Gavia, MD and is actively engaged with the care management team. I reached out to Capricia Labruyere-May by phone today to assist with re-scheduling  with the Licensed Clinical Child psychotherapist.  Follow up plan: Unsuccessful telephone outreach attempt made. A HIPAA compliant phone message was left for the patient providing contact information and requesting a return call. No further outreach attempts will be made at this time. We have been unable to contact the patient to reschedule for complex care management services.   Barnie Bora  [facility]  [facility]Based Care Institute, [facility] Guide  Direct Dial: [phone]  Fax [phone]

## 2024-01-18 DIAGNOSIS — G4733 Obstructive sleep apnea (adult) (pediatric): Secondary | ICD-10-CM | POA: Diagnosis not present

## 2024-01-28 ENCOUNTER — Encounter: Payer: Self-pay | Admitting: *Deleted

## 2024-01-31 ENCOUNTER — Ambulatory Visit: Attending: Surgery | Admitting: Surgery

## 2024-01-31 ENCOUNTER — Encounter: Payer: Self-pay | Admitting: Surgery

## 2024-01-31 VITALS — BP 120/70 | HR 76 | Temp 98.0°F | Ht 62.0 in | Wt 187.0 lb

## 2024-01-31 DIAGNOSIS — I83891 Varicose veins of right lower extremities with other complications: Secondary | ICD-10-CM

## 2024-01-31 NOTE — Progress Notes (Signed)
 [facility]ist of [facility]  Patient name: Alexa Rodriguez MRN: [id number] DOB: 1956-07-27 Sex: female   REASON FOR VISIT:    Follow-up varicose vein  HISOTRY OF PRESENT ILLNESS:    Alexa Rodriguez is a 68 y.o. female who was originally seen by Dr. Susi Eric in September 2024 for lower extremity swelling and pain for the past 5 years.  She had previously been offered a procedure for this but never got this done.  She says her swelling is worse at the end of the day.  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  She feels the right leg is worse than her left.  She has been wearing 20-30 knee-high compression.  She cannot tolerate thigh-high compression.  She tries to keep her legs elevated and exercise.  She has a particular vein behind the right knee that causes her a lot of discomfort.  In addition she had an episode of bleeding from a varix in the anterior thigh last week.  She did not have to go to the hospital   PAST MEDICAL HISTORY:   Past Medical History:  Diagnosis Date   ADHD 01/30/2020   Anxiety    Arthritis    Depression 01/30/2020   Elevated cholesterol    Fibromyalgia 01/30/2020   Healthcare maintenance 08/11/2020   Hypertension    Insomnia 10/21/2020   Murmur 09/23/2020   OSA (obstructive sleep apnea) 01/30/2020   Pneumonia    Stable angina (HCC) 08/09/2020     FAMILY HISTORY:   Family History  Problem Relation Age of Onset   Clotting disorder Mother    Heart disease Mother    Atrial fibrillation Mother    Heart disease Father    Kidney disease Maternal Grandmother    Diabetes Maternal Grandmother    Heart disease Paternal Grandmother    Heart disease Paternal Grandfather    Colon cancer Neg Hx    Esophageal cancer Neg Hx    Pancreatic cancer Neg Hx    Stomach cancer Neg Hx    Breast cancer Neg Hx     SOCIAL HISTORY:   Social History   Tobacco Use    Smoking status: Former    Current packs/day: 0.00    Average packs/day: 1.5 packs/day for 20.0 years (30.0 ttl pk-yrs)    Types: Cigarettes    Start date: 08/24/1989    Quit date: 08/24/2009    Years since quitting: 14.4   Smokeless tobacco: Never  Substance Use Topics   Alcohol  use: Yes    Alcohol /week: 2.0 standard drinks of alcohol     Types: 1 Glasses of wine, 1 Cans of beer per week    Comment: 3 times per week     ALLERGIES:   Allergies  Allergen Reactions   Lopressor [Metoprolol Tartrate] Other (See Comments)    Heart Attacks    Cortisone Hypertension    Elevates B/P    Codeine Nausea And Vomiting   Latex Rash   Neurontin [Gabapentin] Anxiety and Other (See Comments)    Made pt feel angry, and caused anxiety      CURRENT MEDICATIONS:   Current Outpatient Medications  Medication Sig Dispense Refill   atorvastatin  (LIPITOR) 10 MG tablet Take 10 mg by mouth daily.     carboxymethylcellulose (REFRESH PLUS) 0.5 % SOLN Place 1 drop into both eyes daily.     COLLAGEN PO Take 2 capsules by mouth daily.     diclofenac  Sodium (VOLTAREN  ARTHRITIS PAIN) 1 %  GEL Apply 4 g topically 4 (four) times daily. 100 g 0   DULoxetine  (CYMBALTA ) 60 MG capsule Take 1 capsule by mouth once daily 60 capsule 3   fluorometholone  (FML) 0.1 % ophthalmic ointment 1 application  4 (four) times daily as needed (dry/itchy eyes).     furosemide  (LASIX ) 20 MG tablet Take 1 tablet (20 mg total) by mouth daily. 30 tablet 11   methocarbamol  (ROBAXIN ) 750 MG tablet Take 1 tablet (750 mg total) by mouth every 6 (six) hours as needed for muscle spasms. 30 tablet 2   Multiple Vitamins-Minerals (MULTIVITAMIN WITH MINERALS) tablet Take 1 tablet by mouth daily.     nitroGLYCERIN  (NITROSTAT ) 0.4 MG SL tablet Place 1 tablet (0.4 mg total) under the tongue every 5 (five) minutes as needed for chest pain. 5 tablet 1   olmesartan  (BENICAR ) 20 MG tablet Take 1/2 (one-half) tablet by mouth once daily 45 tablet 3   OVER  THE COUNTER MEDICATION Take 12 mg by mouth at bedtime. CBD 12 mg XITE with Delta 9 (9 mg)     OVER THE COUNTER MEDICATION 1 Package daily. Activate C - immune complex     oxyCODONE -acetaminophen  (PERCOCET/ROXICET) 5-325 MG tablet Take 1 tablet by mouth 2 (two) times daily as needed.     Vitamin D-Vitamin K (K2 PLUS D3 PO) Take 1 tablet by mouth daily.     No current facility-administered medications for this visit.    REVIEW OF SYSTEMS:   [X]  denotes positive finding, [ ]  denotes negative finding Cardiac  Comments:  Chest pain or chest pressure:    Shortness of breath upon exertion:    Short of breath when lying flat:    Irregular heart rhythm:        Vascular    Pain in calf, thigh, or hip brought on by ambulation:    Pain in feet at night that wakes you up from your sleep:     Blood clot in your veins: x   Leg swelling:         Pulmonary    Oxygen at home:    Productive cough:     Wheezing:         Neurologic    Sudden weakness in arms or legs:     Sudden numbness in arms or legs:     Sudden onset of difficulty speaking or slurred speech:    Temporary loss of vision in one eye:     Problems with dizziness:         Gastrointestinal    Blood in stool:     Vomited blood:         Genitourinary    Burning when urinating:     Blood in urine:        Psychiatric    Major depression:         Hematologic    Bleeding problems:    Problems with blood clotting too easily:        Skin    Rashes or ulcers:        Constitutional    Fever or chills:      PHYSICAL EXAM:   Vitals:   01/31/24 1454  BP: 120/70  Pulse: 76  Temp: 98 F (36.7 C)  SpO2: 94%  Weight: 187 lb (84.8 kg)  Height: 5\' 2"  (1.575 m)    GENERAL: The patient is a well-nourished female, in no acute distress. The vital signs are documented above. CARDIAC: There is a regular  rate and rhythm.  VASCULAR: SonoSite was used to evaluate the right great saphenous vein.  This was larger than her original  ultrasound.  For the most part it was 5.5 cm or greater.  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  MUSCULOSKELETAL: There are no major deformities or cyanosis. NEUROLOGIC: No focal weakness or paresthesias are detected. SKIN: See photos below PSYCHIATRIC: The patient has a normal affect.         STUDIES:   I have reviewed the following venous reflux study: +--------------+---------+------+-----------+------------+  LEFT         Reflux NoRefluxReflux TimeDiameter cms                          Yes                           +--------------+---------+------+-----------+------------+  CFV                    yes   >1 second               +--------------+---------+------+-----------+------------+  GSV at SFJ              yes    >500 ms      0.68      +--------------+---------+------+-----------+------------+  GSV prox thigh          yes    >500 ms      0.62      +--------------+---------+------+-----------+------------+  GSV mid thigh           yes    >500 ms      0.42      +--------------+---------+------+-----------+------------+  GSV dist thigh          yes    >500 ms      0.38      +--------------+---------+------+-----------+------------+  GSV at knee             yes    >500 ms      0.37      +--------------+---------+------+-----------+------------+  GSV prox calf           yes    >500 ms      0.31      +--------------+---------+------+-----------+------------+  GSV mid calf            yes    >500 ms      0.32      +--------------+---------+------+-----------+------------+  GSV dist calf           yes    >500 ms      0.24      +--------------+---------+------+-----------+------------+  SSV Pop Fossa no                            0.35       +--------------+---------+------+-----------+------------+  SSV prox calf no                            0.35      +--------------+---------+------+-----------+------------+  SSV mid calf  no                            0.30      +--------------+---------+------+-----------+------------+     RIGHT  Reflux NoRefluxReflux TimeDiameter cmsComments                          Yes                                   +--------------+---------+------+-----------+------------+--------+  CFV                    yes   >1 second                       +--------------+---------+------+-----------+------------+--------+  FV mid        no                                              +--------------+---------+------+-----------+------------+--------+  Popliteal    no                                              +--------------+---------+------+-----------+------------+--------+  GSV at SFJ              yes    >500 ms      0.84              +--------------+---------+------+-----------+------------+--------+  GSV prox thigh          yes    >500 ms      0.40              +--------------+---------+------+-----------+------------+--------+  GSV mid thigh no                            0.43              +--------------+---------+------+-----------+------------+--------+  GSV dist thighno                            0.42              +--------------+---------+------+-----------+------------+--------+  GSV at knee   no                            0.40              +--------------+---------+------+-----------+------------+--------+  GSV prox calf no                                              +--------------+---------+------+-----------+------------+--------+  GSV mid calf            yes    >500 ms      0.36              +--------------+---------+------+-----------+------------+--------+  SSV Pop Fossa            yes    >500 ms      0.45              +--------------+---------+------+-----------+------------+--------+  SSV prox calf           yes    >500 ms  0.51              +--------------+---------+------+-----------+------------+--------+  SSV mid calf            yes    >500 ms      0.36              +--------------+---------+------+-----------+------------+--------+  AASV O        no                            0.40              +--------------+---------+------+-----------+------------+--------+  AASV P0.32    no                            0.32              +--------------+---------+------+-----------+------------+--------+  AASV M        no                                              +--------------+---------+------+-----------+------------+--------+       MEDICAL ISSUES:   CEAP class III, right leg: The patient has failed conservative treatment strategies including exercise, leg elevation, and compression stockings.  In addition to pain and itching around her varicosities, she has also had an episode of bleeding last week.  I reevaluated her saphenous vein with SonoSite today and in the distal right thigh measured 5.6 mm as documented above.  I have recommended proceeding with endovenous laser ablation of the right great saphenous vein with greater than 20 stab phlebectomy sites to treat her prominent varicosities and making sure that the 1 in her popliteal fossa is addressed.  She also has multiple areas of reticular and spider veins that would benefit from 2 vials of sclerotherapy treatment.    Marti Slates, MD, FACS [facility] [phone] Pager [phone]

## 2024-02-22 ENCOUNTER — Other Ambulatory Visit: Payer: Self-pay | Admitting: *Deleted

## 2024-02-22 DIAGNOSIS — I83891 Varicose veins of right lower extremities with other complications: Secondary | ICD-10-CM

## 2024-04-26 ENCOUNTER — Ambulatory Visit

## 2024-04-26 ENCOUNTER — Telehealth: Payer: Self-pay

## 2024-04-26 ENCOUNTER — Other Ambulatory Visit: Payer: Self-pay | Admitting: *Deleted

## 2024-04-26 VITALS — Ht 62.0 in | Wt 187.0 lb

## 2024-04-26 DIAGNOSIS — Z0000 Encounter for general adult medical examination without abnormal findings: Secondary | ICD-10-CM

## 2024-04-26 MED ORDER — LORAZEPAM 1 MG PO TABS: ORAL_TABLET | ORAL | 0 refills | Status: AC

## 2024-04-26 NOTE — Patient Instructions (Signed)
 Alexa Rodriguez , Thank you for taking time out of your busy schedule to complete your Annual Wellness Visit with me. I enjoyed our conversation and look forward to speaking with you again next year. I, as well as your care team,  appreciate your ongoing commitment to your health goals. Please review the following plan we discussed and let me know if I can assist you in the future. Your Game plan/ To Do List    Referrals: If you haven't heard from the office you've been referred to, please reach out to them at the phone provided.   Follow up Visits: We will see or speak with you next year for your Next Medicare AWV with our clinical staff Have you seen your provider in the last 6 months (3 months if uncontrolled diabetes)? Yes  Clinician Recommendations:  Aim for 30 minutes of exercise or brisk walking, 6-8 glasses of water, and 5 servings of fruits and vegetables each day.       This is a list of the screenings recommended for you:  Health Maintenance  Topic Date Due   Hepatitis C Screening  Never done   Zoster (Shingles) Vaccine (1 of 2) Never done   Screening for Lung Cancer  10/07/2021   Flu Shot  03/24/2024   COVID-19 Vaccine (3 - 2025-26 season) 04/24/2024   Medicare Annual Wellness Visit  04/26/2025   Colon Cancer Screening  06/29/2025   Mammogram  10/11/2025   DTaP/Tdap/Td vaccine (2 - Tdap) 01/30/2031   Pneumococcal Vaccine for age over 28  Completed   DEXA scan (bone density measurement)  Completed   HPV Vaccine  Aged Out   Meningitis B Vaccine  Aged Out    Advanced directives: (In Chart) A copy of your advanced directives are scanned into your chart should your provider ever need it. Advance Care Planning is important because it:  [x]  Makes sure you receive the medical care that is consistent with your values, goals, and preferences  [x]  It provides guidance to your family and loved ones and reduces their decisional burden about whether or not they are making the  right decisions based on your wishes.  Follow the link provided in your after visit summary or read over the paperwork we have mailed to you to help you started getting your Advance Directives in place. If you need assistance in completing these, please reach out to us  so that we can help you!  See attachments for Preventive Care and Fall Prevention Tips.

## 2024-04-26 NOTE — Progress Notes (Signed)
 Because this visit was a virtual/telehealth visit,  certain criteria was not obtained, such a blood pressure, CBG if applicable, and timed get up and go. Any medications not marked as taking were not mentioned during the medication reconciliation part of the visit. Any vitals not documented were not able to be obtained due to this being a telehealth visit or patient was unable to self-report a recent blood pressure reading due to a lack of equipment at home via telehealth. Vitals that have been documented are verbally provided by the patient.   Subjective:   Alexa Rodriguez is a 68 y.o. who presents for a Medicare Wellness preventive visit.  As a reminder, Annual Wellness Visits don't include a physical exam, and some assessments may be limited, especially if this visit is performed virtually. We may recommend an in-person follow-up visit with your provider if needed.  Visit Complete: Virtual I connected with  Alexa Rodriguez on 04/26/24 by a audio enabled telemedicine application and verified that I am speaking with the correct person using two identifiers.  Patient Location: Other:  [facility]  Provider Location: Office/Clinic  I discussed the limitations of evaluation and management by telemedicine. The patient expressed understanding and agreed to proceed.  Vital Signs: Because this visit was a virtual/telehealth visit, some criteria may be missing or patient reported. Any vitals not documented were not able to be obtained and vitals that have been documented are patient reported.  VideoDeclined- This patient declined Librarian, academic. Therefore the visit was completed with audio only.  Persons Participating in Visit: Patient.  AWV Questionnaire: No: Patient Medicare AWV questionnaire was not completed prior to this visit.  Cardiac Risk Factors include: advanced age (>92men, >29 women);hypertension;family history of premature cardiovascular  disease;obesity (BMI >30kg/m2) (WORKS [employer] FARM)     Objective:    Today's Vitals   04/26/24 1502  Weight: 187 lb (84.8 kg)  Height: 5' 2 (1.575 m)  PainSc: 6   PainLoc: Generalized   Body mass index is 34.2 kg/m.     04/26/2024    3:07 PM 12/15/2023   10:37 AM 05/05/2023    3:54 PM 10/30/2022    9:52 AM 10/30/2022    9:34 AM 08/11/2022    3:41 PM 02/03/2022    1:33 PM  Advanced Directives  Does Patient Have a Medical Advance Directive? Yes Yes Yes Yes Yes Yes Yes  Type of Estate agent of [place];Living will Living will Healthcare Power of [employer] Power of Attorney Living will Healthcare Power of [place];Living will Healthcare Power of [place];Living will  Does patient want to make changes to medical advance directive? No - Patient declined     No - Patient declined   Copy of Healthcare Power of Attorney in Chart? Yes - validated most recent copy scanned in chart (See row information)  Yes - validated most recent copy scanned in chart (See row information) Yes - validated most recent copy scanned in chart (See row information)  Yes - validated most recent copy scanned in chart (See row information)     Current Medications (verified) Outpatient Encounter Medications as of 04/26/2024  Medication Sig   atorvastatin  (LIPITOR) 10 MG tablet Take 10 mg by mouth daily.   carboxymethylcellulose (REFRESH PLUS) 0.5 % SOLN Place 1 drop into both eyes daily.   COLLAGEN PO Take 2 capsules by mouth daily.   diclofenac  Sodium (VOLTAREN  ARTHRITIS PAIN) 1 % GEL Apply 4 g topically 4 (four) times daily.   DULoxetine  (  CYMBALTA ) 60 MG capsule Take 1 capsule by mouth once daily   fluorometholone  (FML) 0.1 % ophthalmic ointment 1 application  4 (four) times daily as needed (dry/itchy eyes).   furosemide  (LASIX ) 20 MG tablet Take 1 tablet (20 mg total) by mouth daily.   LORazepam  (ATIVAN ) 1 MG tablet Take 1 tablet 30 to 60 minutes prior to leaving house on day of office  surgery. Bring second tablet with you to office on day of office surgery.   methocarbamol  (ROBAXIN ) 750 MG tablet Take 1 tablet (750 mg total) by mouth every 6 (six) hours as needed for muscle spasms.   Multiple Vitamins-Minerals (MULTIVITAMIN WITH MINERALS) tablet Take 1 tablet by mouth daily.   nitroGLYCERIN  (NITROSTAT ) 0.4 MG SL tablet Place 1 tablet (0.4 mg total) under the tongue every 5 (five) minutes as needed for chest pain.   olmesartan  (BENICAR ) 20 MG tablet Take 1/2 (one-half) tablet by mouth once daily   OVER THE COUNTER MEDICATION Take 12 mg by mouth at bedtime. CBD 12 mg XITE with Delta 9 (9 mg)   OVER THE COUNTER MEDICATION 1 Package daily. Activate C - immune complex   oxyCODONE -acetaminophen  (PERCOCET/ROXICET) 5-325 MG tablet Take 1 tablet by mouth 2 (two) times daily as needed.   Vitamin D-Vitamin K (K2 PLUS D3 PO) Take 1 tablet by mouth daily.   No facility-administered encounter medications on file as of 04/26/2024.    Allergies (verified) Lopressor [metoprolol tartrate], Cortisone, Codeine, Latex, and Neurontin [gabapentin]   History: Past Medical History:  Diagnosis Date   ADHD 01/30/2020   Anxiety    Arthritis    Depression 01/30/2020   Elevated cholesterol    Fibromyalgia 01/30/2020   Healthcare maintenance 08/11/2020   Hypertension    Insomnia 10/21/2020   Murmur 09/23/2020   OSA (obstructive sleep apnea) 01/30/2020   Pneumonia    Stable angina (HCC) 08/09/2020   Past Surgical History:  Procedure Laterality Date   BREAST LUMPECTOMY Left 1990   benign   CARDIAC CATHETERIZATION     2004 in [place]    JOINT REPLACEMENT     right knee   TONSILLECTOMY     TRANSFORAMINAL LUMBAR INTERBODY FUSION W/ MIS 1 LEVEL N/A 02/09/2022   Procedure: LUMBAR FOUR-FIVE MINIMALLY INVASIVE (MIS) TRANSFORAMINAL LUMBAR INTERBODY FUSION;  Surgeon: Cheryle Debby LABOR, MD;  Location: [facility] OR;  Service: Neurosurgery;  Laterality: N/A;   VAGINAL HYSTERECTOMY     Family History   Problem Relation Age of Onset   Clotting disorder Mother    Heart disease Mother    Atrial fibrillation Mother    Heart disease Father    Kidney disease Maternal Grandmother    Diabetes Maternal Grandmother    Heart disease Paternal Grandmother    Heart disease Paternal Grandfather    Colon cancer Neg Hx    Esophageal cancer Neg Hx    Pancreatic cancer Neg Hx    Stomach cancer Neg Hx    Breast cancer Neg Hx    Social History   Socioeconomic History   Marital status: Married    Spouse name: Not on file   Number of children: Not on file   Years of education: Not on file   Highest education level: Not on file  Occupational History   Not on file  Tobacco Use   Smoking status: Former    Current packs/day: 0.00    Average packs/day: 1.5 packs/day for 20.0 years (30.0 ttl pk-yrs)    Types: Cigarettes    Start date:  08/24/1989    Quit date: 08/24/2009    Years since quitting: 14.6   Smokeless tobacco: Never  Vaping Use   Vaping status: Former  Substance and Sexual Activity   Alcohol  use: Yes    Alcohol /week: 2.0 standard drinks of alcohol     Types: 1 Glasses of wine, 1 Cans of beer per week    Comment: 3 times per week   Drug use: Not on file   Sexual activity: Not on file  Other Topics Concern   Not on file  Social History Narrative   Not on file   Social Drivers of Health   Financial Resource Strain: Low Risk  (04/26/2024)   Overall Financial Resource Strain (CARDIA)    Difficulty of Paying Living Expenses: Not very hard  Food Insecurity: No Food Insecurity (04/26/2024)   Hunger Vital Sign    Worried About Running Out of Food in the Last Year: Never true    Ran Out of Food in the Last Year: Never true  Transportation Needs: No Transportation Needs (04/26/2024)   PRAPARE - Administrator, Civil Service (Medical): No    Lack of Transportation (Non-Medical): No  Physical Activity: Sufficiently Active (04/26/2024)   Exercise Vital Sign    Days of Exercise per  Week: 5 days    Minutes of Exercise per Session: 30 min  Stress: No Stress Concern Present (04/26/2024)   [employer] of Occupational Health - Occupational Stress Questionnaire    Feeling of Stress: Not at all  Social Connections: Socially Integrated (04/26/2024)   Social Connection and Isolation Panel    Frequency of Communication with Friends and Family: More than three times a week    Frequency of Social Gatherings with Friends and Family: Twice a week    Attends Religious Services: More than 4 times per year    Active Member of [employer] or Organizations: Yes    Attends Engineer, structural: More than 4 times per year    Marital Status: Married    Tobacco Counseling Counseling given: Not Answered    Clinical Intake:  Pre-visit preparation completed: Yes  Pain : 0-10 Pain Score: 6  (PAIN MEDICATION) Pain Location: Generalized (BILATERAL LEGS, LEFT HAND, RIGHT SHOULDER, LOW BACK PAIN) Pain Relieving Factors: PAIN MEDICATIONS  Pain Relieving Factors: PAIN MEDICATIONS  BMI - recorded: 34.2 Nutritional Status: BMI > 30  Obese Nutritional Risks: None Diabetes: No  Lab Results  Component Value Date   HGBA1C 5.6 10/30/2022   HGBA1C 5.9 (H) 10/01/2021   HGBA1C 5.8 (H) 08/08/2020     How often do you need to have someone help you when you read instructions, pamphlets, or other written materials from your doctor or pharmacy?: 1 - Never  Interpreter Needed?: No  Information entered by :: Roz Fuller, LPN.   Activities of Daily Living     04/26/2024    3:07 PM 12/15/2023   10:37 AM  In your present state of health, do you have any difficulty performing the following activities:  Hearing? 1 1  Comment RIGHT EAR   Vision? 0 0  Difficulty concentrating or making decisions? 1 0  Comment CONCENTRATING, UNTREATED ADHD   Walking or climbing stairs? 0 1  Dressing or bathing? 0 0  Doing errands, shopping? 0 0  Preparing Food and eating ? N   Using the Toilet? N    In the past six months, have you accidently leaked urine? Y   Comment WEARS PROTECTION   Do you  have problems with loss of bowel control? N   Managing your Medications? N   Managing your Finances? N   Housekeeping or managing your Housekeeping? N     Patient Care Team: Amilibia, Jaden, DO as PCP - General Tobb, Kardie, DO as PCP - Cardiology (Cardiology) [facility] [place], [place] as Consulting Physician (Optometry)  I have updated your Care Teams any recent Medical Services you may have received from other providers in the past year.     Assessment:   This is a routine wellness examination for Alexa Rodriguez.  Hearing/Vision screen Hearing Screening - Comments:: Patient has hearing difficulties in right ear. Vision Screening - Comments:: Wears rx glasses - up to date with routine eye exams with [facility]    Goals Addressed             This Visit's Progress    04/26/24: To lose 50 pounds.         Depression Screen     04/26/2024    3:09 PM 12/15/2023   10:33 AM 05/05/2023    3:53 PM 10/30/2022    9:52 AM 10/30/2022    9:33 AM 01/30/2022   11:04 AM 10/01/2021    2:45 PM  PHQ 2/9 Scores  PHQ - 2 Score 0 3 0   0 2  PHQ- 9 Score 0 15     11  Exception Documentation    Patient refusal Patient refusal      Fall Risk     04/26/2024    3:07 PM 12/15/2023   10:31 AM 05/05/2023    3:53 PM 10/30/2022    9:52 AM 10/30/2022    9:33 AM  Fall Risk   Falls in the past year? 1 1 1 1 1   Number falls in past yr: 1 1 1 1 1   Injury with Fall? 0 1 0 0 0  Risk for fall due to : History of fall(s) Other (Comment) Impaired balance/gait History of fall(s);Impaired balance/gait History of fall(s);Impaired balance/gait  Follow up Falls evaluation completed;Education provided Falls prevention discussed;Falls evaluation completed Falls evaluation completed;Falls prevention discussed Falls evaluation completed;Falls prevention discussed Falls evaluation completed;Falls prevention discussed    MEDICARE  RISK AT HOME:  Medicare Risk at Home Any stairs in or around the home?: Yes (1 STEP TO FAMILY ROOM, 2 STEPS FRONT ENTRY, RAMP) If so, are there any without handrails?: No Home free of loose throw rugs in walkways, pet beds, electrical cords, etc?: Yes Adequate lighting in your home to reduce risk of falls?: Yes Life alert?: No Use of a cane, walker or w/c?: No Grab bars in the bathroom?: Yes Shower chair or bench in shower?: Yes (BUILT IN SEAT) Elevated toilet seat or a handicapped toilet?: No  TIMED UP AND GO:  Was the test performed?  No  Cognitive Function: Declined/Normal: No cognitive concerns noted by patient or family. Patient alert, oriented, able to answer questions appropriately and recall recent events. No signs of memory loss or confusion.    04/26/2024    3:09 PM  MMSE - Mini Mental State Exam  Not completed: Unable to complete        04/26/2024    3:04 PM 10/30/2022    9:52 AM  6CIT Screen  What Year? 0 points 0 points  What month? 0 points 0 points  What time? 0 points 0 points  Count back from 20 0 points 0 points  Months in reverse 0 points 0 points  Repeat phrase  0 points 0 points  Total Score 0 points 0 points    Immunizations Immunization History  Administered Date(s) Administered   Fluad Quad(high Dose 65+) 10/01/2021   PFIZER(Purple Top)SARS-COV-2 Vaccination 10/12/2019, 05/12/2020   PNEUMOCOCCAL CONJUGATE-20 10/01/2021   Td 01/29/2021    Screening Tests Health Maintenance  Topic Date Due   Hepatitis C Screening  Never done   Zoster Vaccines- Shingrix (1 of 2) Never done   Lung Cancer Screening  10/07/2021   INFLUENZA VACCINE  03/24/2024   COVID-19 Vaccine (3 - 2025-26 season) 04/24/2024   Medicare Annual Wellness (AWV)  04/26/2025   Colonoscopy  06/29/2025   MAMMOGRAM  10/11/2025   DTaP/Tdap/Td (2 - Tdap) 01/30/2031   Pneumococcal Vaccine: 50+ Years  Completed   DEXA SCAN  Completed   HPV VACCINES  Aged Out   Meningococcal B Vaccine  Aged  Out    Health Maintenance  Health Maintenance Due  Topic Date Due   Hepatitis C Screening  Never done   Zoster Vaccines- Shingrix (1 of 2) Never done   Lung Cancer Screening  10/07/2021   INFLUENZA VACCINE  03/24/2024   COVID-19 Vaccine (3 - 2025-26 season) 04/24/2024   Health Maintenance Items Addressed: Yes Patient is aware of current care gaps.  Patient is due for the following: Lung Cancer Screening, Hepatitis C Screening.  Patient declined vaccines.   Additional Screening:  Vision Screening: Recommended annual ophthalmology exams for early detection of glaucoma and other disorders of the eye. Would you like a referral to an eye doctor? Yes    Dental Screening: Recommended annual dental exams for proper oral hygiene  Community Resource Referral / Chronic Care Management: CRR required this visit?  No   CCM required this visit?  No   Plan:    I have personally reviewed and noted the following in the patient's chart:   Medical and social history Use of alcohol , tobacco or illicit drugs  Current medications and supplements including opioid prescriptions. Patient is currently taking opioid prescriptions. Information provided to patient regarding non-opioid alternatives. Patient advised to discuss non-opioid treatment plan with their provider. Functional ability and status Nutritional status Physical activity Advanced directives List of other physicians Hospitalizations, surgeries, and ER visits in previous 12 months Vitals Screenings to include cognitive, depression, and falls Referrals and appointments  In addition, I have reviewed and discussed with patient certain preventive protocols, quality metrics, and best practice recommendations. A written personalized care plan for preventive services as well as general preventive health recommendations were provided to patient.   Roz LOISE Fuller, LPN   [id number]   After Visit Summary: (MyChart) Due to this being a  telephonic visit, the after visit summary with patients personalized plan was offered to patient via MyChart   Notes: Patient is aware of current care gaps.  Patient is due for the following: Lung Cancer Screening, Hepatitis C Screening.  Patient declined vaccines. Patient is requesting a referral to opthmaologist and OB/GYN.

## 2024-04-26 NOTE — Telephone Encounter (Signed)
 Patient stated during her AWV that she would like to be referred to the following specialists:  Opthmaologist and OB/GYN. Patient would like to have a DEXA Scan, Bone Density and Mammogram.  Charron Coultas N. Tomie, LPN [facility] Annual Wellness Team Direct Dial: [phone]

## 2024-04-28 ENCOUNTER — Other Ambulatory Visit: Payer: Self-pay

## 2024-04-28 DIAGNOSIS — R32 Unspecified urinary incontinence: Secondary | ICD-10-CM

## 2024-04-28 DIAGNOSIS — Z1231 Encounter for screening mammogram for malignant neoplasm of breast: Secondary | ICD-10-CM

## 2024-04-28 DIAGNOSIS — Z135 Encounter for screening for eye and ear disorders: Secondary | ICD-10-CM

## 2024-04-28 DIAGNOSIS — M8580 Other specified disorders of bone density and structure, unspecified site: Secondary | ICD-10-CM

## 2024-04-28 NOTE — Progress Notes (Signed)
 Received a message from [facility], LPN about this patient requesting referrals and DEXA scans and a screening mammogram for her AWV on 9/9. Will place these orders for referrals and DEXA. Patient had mammogram in 09/2023, and is not due until 09/2024 for her next screening exam. Will place the order, with the caveat that she is told she is to go in February to have it done. She is due for her DEXA screening exam, as her last DEXA was in 11/2021.  Halleigh Comes, DO Internal Medicine Resident, PGY-1 [facility] 2:27 PM 04/28/2024

## 2024-05-04 ENCOUNTER — Ambulatory Visit: Attending: Surgery | Admitting: Surgery

## 2024-05-04 ENCOUNTER — Encounter: Payer: Self-pay | Admitting: Surgery

## 2024-05-04 VITALS — BP 145/77 | HR 86 | Temp 97.7°F | Resp 16 | Ht 63.0 in | Wt 197.0 lb

## 2024-05-04 DIAGNOSIS — I83891 Varicose veins of right lower extremities with other complications: Secondary | ICD-10-CM | POA: Diagnosis not present

## 2024-05-04 HISTORY — PX: LASER ABLATION: SHX1947

## 2024-05-04 NOTE — Progress Notes (Signed)
    Laser Ablation Procedure    Date: 05/04/2024 Alexa Rodriguez DOB:04-Feb-1956 Consent signed: Yes     Surgeon: Dr. Gaile New  Procedure: Laser Ablation: right Greater Saphenous Vein  BP (!) 145/77 (BP Location: Left Arm, Patient Position: Sitting, Cuff Size: Normal)   Pulse 86   Temp 97.7 F (36.5 C) (Temporal)   Resp 16   Ht 5' 3 (1.6 m)   Wt 197 lb (89.4 kg)   SpO2 100%   BMI 34.90 kg/m   Tumescent Anesthesia: 490 cc 0.9% NaCl with 50 cc Lidocaine  HCL 1%  and 15 cc 8.4% NaHCO3 Local Anesthesia: 6 cc Lidocaine  HCL and NaHCO3 (ratio 2:1) 7 watts continuous mode    Total energy: 314 seconds    Total time: 2205.0 joules Treatment Length 44 cm Laser Fiber Ref. #  [id number]          Lot # [id number] Stab Phlebectomy: 10-20 Sites: Calf and Ankle  Patient tolerated procedure well Notes:   All staff members wore facial masks. Pt had 1 mg ativan  at 08:48 am and another 1 mg at 10:59 am . Making a total of 2 mg  of ativan  prior to the surgical procedure .  Description of Procedure: After marking the course of the secondary varicosities, the patient was placed on the operating table in the supine position, and the right leg was prepped and draped in sterile fashion.   Local anesthetic was administered and under ultrasound guidance the saphenous vein was accessed with a micro needle and guide wire; then the mirco puncture sheath was placed.  A guide wire was inserted saphenofemoral junction , followed by a 5 french sheath.  The position of the sheath and then the laser fiber below the junction was confirmed using the ultrasound.  Tumescent anesthesia was administered along the course of the saphenous vein using ultrasound guidance. The patient was placed in Trendelenburg position and protective laser glasses were placed on patient and staff, and the laser was fired at 7 watts continuous mode for a total of 2205.0 joules.  For stab phlebectomies, local anesthetic was administered at the  previously marked varicosities, and tumescent anesthesia was administered around the vessels.  10-20 stab wounds were made using the tip of an 11 blade. And using the vein hook, the phlebectomies were performed using a hemostat to avulse the varicosities.  Adequate hemostasis was achieved.   Steri strips were applied to the stab wounds and ABD pads and thigh high compression stockings were applied.  Ace wrap bandages were applied over the phlebectomy sites and at the top of the saphenofemoral junction. Blood loss was less than 15 cc.  Discharge instructions reviewed with patient and hardcopy of discharge instructions given to patient to take home. The patient was wheeled out of the operating room having tolerated the procedure well.

## 2024-05-05 ENCOUNTER — Other Ambulatory Visit: Payer: Self-pay

## 2024-05-05 DIAGNOSIS — I872 Venous insufficiency (chronic) (peripheral): Secondary | ICD-10-CM

## 2024-05-09 NOTE — Progress Notes (Signed)
 Internal Medicine Attending:  I reviewed the AWV findings of the medical professional who conducted the visit. I was present in the office suite and immediately available to provide assistance and direction throughout the time the service was provided.  Patient overdue for follow-up. Message sent to scheduling for next available appointment, address requested referrals at that visit.

## 2024-05-18 ENCOUNTER — Ambulatory Visit (HOSPITAL_COMMUNITY)
Admission: RE | Admit: 2024-05-18 | Discharge: 2024-05-18 | Disposition: A | Discharge status: Home / Self Care | Source: Ambulatory Visit | Attending: Surgery | Admitting: Surgery

## 2024-05-18 ENCOUNTER — Ambulatory Visit (INDEPENDENT_AMBULATORY_CARE_PROVIDER_SITE_OTHER): Admitting: Surgery

## 2024-05-18 ENCOUNTER — Encounter: Payer: Self-pay | Admitting: Surgery

## 2024-05-18 VITALS — BP 129/83 | HR 78 | Ht 63.0 in | Wt 192.0 lb

## 2024-05-18 DIAGNOSIS — I83891 Varicose veins of right lower extremities with other complications: Secondary | ICD-10-CM

## 2024-05-18 NOTE — Progress Notes (Signed)
 Subjective:     Patient ID: Alexa Rodriguez, female   DOB: [birth year], 68 y.o.   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  She is now status post right greater saphenous vein ablation and stab phlebectomy between 10 and 20 sites.  She is here today for follow-up with ultrasound.  Her right leg is recovering well.  She is mostly complaining of left knee pain posterior in the popliteal fossa.  She has been evaluated by orthopedics and underwent injection without resolution of her pain.  She did have a slight fall no other trauma.  She does not have any bruising.  She is worried that the pain is caused by her varicosities in the left lower extremity.   Review of Systems Left knee pain    Objective:   Physical Exam Vitals:   05/18/24 1035  BP: 129/83  Pulse: 78  SpO2: 95%            Venous Reflux Times  +---------+---------+------+-----------+------------+--------+  RIGHT   Reflux NoRefluxReflux TimeDiameter cmsComments                     Yes                                   +---------+---------+------+-----------+------------+--------+  CFV                                           patent    +---------+---------+------+-----------+------------+--------+  FV mid                                         patent    +---------+---------+------+-----------+------------+--------+  Popliteal                                     patent    +---------+---------+------+-----------+------------+--------+        Summary:  Right:  - No evidence of deep vein thrombosis from the common femoral through the  popliteal veins.  - Successful ablation of the great saphenous vein from the proximal thigh  up to approximately 1.54 cm from the SFJ.        Assessment:     68 year old female status post right greater saphenous vein ablation and stab  phlebectomy with satisfactory ultrasound and recovering well.  Chief complaint today is left knee pain    Plan:     Her left knee pain does not appear to be secondary to varicosities or venous reflux although she does have a refluxing greater saphenous vein on the left.  She has been evaluated by orthopedic surgery with no evidence of structural damage.  She has undergone cortisone injection but continues to have pain.  We will refer her to sports medicine for further evaluation.  She is a candidate for sclerotherapy and she was provided business card for Inocente Row, RN today  She can follow-up as needed for evaluation of treatment left lower extremity venous reflux we discussed that I would not pursue any further intervention until her left posterior knee pain has resolved.  She will continue thigh-high compression stockings   Tomie Spizzirri C. Sheree, MD [facility] Office: [phone] Pager: [phone]

## 2024-05-25 NOTE — Progress Notes (Signed)
 Pt was not seen , signed in for appointment but never showed. I had looked at chart to see what type of blood work she was requestig to have drawn.

## 2024-05-29 NOTE — Progress Notes (Signed)
 Attempted to contact the patient to discuss upcoming appointments and recommended health maintenance screenings for the year.  A Care Connections Specialist has reviewed the patient's chart to identify any gaps in care and outstanding preventive health needs. However, we were unable to reach the patient to review or schedule the necessary follow-up appointments and screenings.  NA  Left message: yes   MyChart message sent: no   Voicemail was left including callback details and our hours of operation.  Comments: added colonoscopy report from care everywhere.  Health Maintenance Due  Topic Date Due  . Colorectal Cancer Screening  Never done

## 2024-06-08 ENCOUNTER — Ambulatory Visit: Admitting: Sports Medicine

## 2024-06-15 ENCOUNTER — Ambulatory Visit

## 2024-06-15 NOTE — Progress Notes (Unsigned)
   Established Patient Office Visit  Subjective   Patient ID: Alexa Rodriguez, female    DOB: Mar 21, 1956  Age: 68 y.o. MRN: [id number]  No chief complaint on file.   Alexa Rodriguez is a 68 year old female with a past medical history of hypertension, CAD, OSA, and osteopenia who presents today for follow up. Please see problem-based assessment and plan below for details.       ROS    Objective:    There were no vitals taken for this visit.   Physical Exam   No results found for any visits on 06/16/24.   The ASCVD Risk score (Arnett DK, et al., 2019) failed to calculate for the following reasons:   Risk score cannot be calculated because patient has a medical history suggesting prior/existing ASCVD    Assessment & Plan:   Patient seen with {IMTSattending2025/2026:32924}.   Problem List Items Addressed This Visit   None   No follow-ups on file.    Alexa Houpt, DO Internal Medicine Resident, PGY-1 8:41 PM 06/15/2024

## 2024-06-15 NOTE — Patient Instructions (Incomplete)
 Thank you, Ms.Bryannah Labruyere-May for allowing us  to provide your care today. Today we discussed your referrals, upcoming imaging, and other concerns.    Referrals: -  New medications: -  I have ordered the following labs for you:  Lab Orders  No laboratory test(s) ordered today     I will call if any are abnormal. All of your labs can be accessed through My Chart.   My Chart Access: [email or web address]  Please follow-up in:    We look forward to seeing you next time. Please call our clinic at [phone] if you have any questions or concerns. The best time to call is Monday-Friday from 9am-4pm, but there is someone available 24/7. If after hours or the weekend, call the main hospital number and ask for the Internal Medicine Resident On-Call. If you need medication refills, please notify your pharmacy one week in advance and they will send us  a request.   Thank you for letting us  take part in your care. Wishing you the best!  Leslea Vowles, DO 06/15/2024, 8:48 PM [facility]

## 2024-06-16 ENCOUNTER — Other Ambulatory Visit: Payer: Self-pay

## 2024-06-16 ENCOUNTER — Ambulatory Visit (INDEPENDENT_AMBULATORY_CARE_PROVIDER_SITE_OTHER)

## 2024-06-16 VITALS — BP 138/83 | HR 66 | Temp 97.8°F | Ht 63.0 in | Wt 196.4 lb

## 2024-06-16 DIAGNOSIS — S20363D Insect bite (nonvenomous) of bilateral front wall of thorax, subsequent encounter: Secondary | ICD-10-CM | POA: Diagnosis not present

## 2024-06-16 DIAGNOSIS — Z780 Asymptomatic menopausal state: Secondary | ICD-10-CM | POA: Diagnosis not present

## 2024-06-16 DIAGNOSIS — M8580 Other specified disorders of bone density and structure, unspecified site: Secondary | ICD-10-CM

## 2024-06-16 DIAGNOSIS — W57XXXD Bitten or stung by nonvenomous insect and other nonvenomous arthropods, subsequent encounter: Secondary | ICD-10-CM

## 2024-06-16 DIAGNOSIS — Z0000 Encounter for general adult medical examination without abnormal findings: Secondary | ICD-10-CM

## 2024-06-16 DIAGNOSIS — Z79899 Other long term (current) drug therapy: Secondary | ICD-10-CM | POA: Diagnosis not present

## 2024-06-16 DIAGNOSIS — W57XXXA Bitten or stung by nonvenomous insect and other nonvenomous arthropods, initial encounter: Secondary | ICD-10-CM | POA: Insufficient documentation

## 2024-06-16 DIAGNOSIS — A6009 Herpesviral infection of other urogenital tract: Secondary | ICD-10-CM | POA: Diagnosis not present

## 2024-06-16 NOTE — Assessment & Plan Note (Addendum)
 During AWV, patient requested referrals to OBGYN and ophthalmology, as well as a renewed order for a DEXA scan and mammogram. Patient is due for DEXA scan, order is placed. Screening mammogram will be due in 09/2024. Order is placed for patient to have done 10/14/2024 or later. Patient is due for flu, shingles vaccines and Hep C screening, as well as lung cancer screening. Unable to address vaccines or referrals at this time due to concerns surrounding patient's recent tick exposure, but should be discussed at next visit.   - DEXA scan  - Screening mammogram

## 2024-06-16 NOTE — Assessment & Plan Note (Signed)
 Patient's main concern today is her recent exposure and bite from a tick in late July.  She had been [place] when she had been bitten by 2 ticks.  She also has cats that may have brought in these ticks. She had seen the ticks themselves and removed them from her body.  She did have a residual bull's-eye rash that persisted for 4-6 weeks, please see picture in media.  She tried to see her new PCP for this, however was unable to be seen at that appointment due to scheduling concerns.  She was never given any antibiotics, and is very concerned that she may potentially have Lyme disease, as her husband is currently an active cancer patient undergoing chemoradiation.  She is his primary caretaker.  She feels malaise and fatigued, with joint pains, and describes her whole body feels like it is burning as though someone injected acid into her.  She would like to have blood work done to see if she has chronic Lyme's disease.  Discussed with patient that we could do a Western blot test assess for immunoglobulins that would show for Lyme's disease.  If positive, would send the patient doxycycline however will wait until results finalized.  Patient is amenable to plan.  - Western blot, IgG - If positive, will send doxycycline -If persistent, consider infectious disease referral

## 2024-06-16 NOTE — Assessment & Plan Note (Signed)
 Patient had seen new PCP (due to health insurance discrepancy) who had prescribed her Valtrex  1000mg  daily due to progression of her herpes infection. 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 Had not been able to see OBGYN. Patient states that the herpes then became Shingles near her eye, for which her eye doctor increased her Valtrex  dose to 1000mg  and told the patient she would need to take Valtrex  for 1 year. During her most recent visit with the new PCP at [facility], her Valtrex  was refilled for 90 days. Wanted her medication list to be updated so she can have medications refilled if/when she runs out. No evidence on today's exam of vesicles on patient's face.   -Valtrex  1000mg   -If persistent infection at next visit, consider pelvic exam or referral to [facility]

## 2024-06-16 NOTE — Assessment & Plan Note (Addendum)
 DEXA order sent. Last DEXA scan from 11/2021 shows 8yr probability for hip fracture 0.7%, 10 year probability for major osteoporotic fracture 7.9%. T score for L femur, lumbar spine are -1.2, -0.5, respectively. Patient taking Calcium  and vitamin D supplementation. Informed patient she can call [facility] imaging to have her DEXA scan done at her convenience.

## 2024-06-20 ENCOUNTER — Ambulatory Visit: Payer: Self-pay

## 2024-06-20 LAB — LYME DISEASE, WESTERN BLOT
IgG P18 Ab.: ABSENT
IgG P23 Ab.: ABSENT
IgG P28 Ab.: ABSENT
IgG P30 Ab.: ABSENT
IgG P39 Ab.: ABSENT
IgG P45 Ab.: ABSENT
IgG P58 Ab.: ABSENT
IgG P66 Ab.: ABSENT
IgG P93 Ab.: ABSENT
IgM P39 Ab.: ABSENT
IgM P41 Ab.: ABSENT
Lyme IgG Wb: NEGATIVE
Lyme IgM Wb: NEGATIVE

## 2024-06-20 NOTE — Progress Notes (Signed)
 Lyme Disease western blot negative. Called and spoke with patient, and she voiced understanding.

## 2024-06-26 ENCOUNTER — Encounter: Payer: Self-pay | Admitting: Radiology

## 2024-06-28 NOTE — Progress Notes (Signed)
Internal Medicine Clinic Attending  I was physically present during the key portions of the resident provided service and participated in the medical decision making of patient's management care. I reviewed pertinent patient test results.  The assessment, diagnosis, and plan were formulated together and I agree with the documentation in the resident's note.  Williams, Julie Anne, MD  

## 2024-07-10 ENCOUNTER — Ambulatory Visit: Attending: Surgery

## 2024-07-10 ENCOUNTER — Other Ambulatory Visit: Payer: Self-pay

## 2024-07-10 DIAGNOSIS — I872 Venous insufficiency (chronic) (peripheral): Secondary | ICD-10-CM

## 2024-07-10 NOTE — Progress Notes (Signed)
 Treated pt's RLE spider veins and small reticular veins with Asclera 1%. She tolerated well; easy access. Pt received a total of 2 ml/20 mg of Asclera 1% administered with a 27 gauge butterfly needle. She was placed in 20-30 mm hg thigh high compression hose and was given post treatment care instructions on handout and verbally. She is having a lot of similar pain/heaviness/swelling in her LLE and will be scheduled for a reflux study and to see MD.

## 2024-07-18 ENCOUNTER — Other Ambulatory Visit: Payer: Self-pay

## 2024-07-18 NOTE — Telephone Encounter (Unsigned)
 Copied from CRM #[id number]. Topic: Clinical - Medication Refill >> Jul 18, 2024  4:18 PM DeAngela L wrote: Medication: valACYclovir  (VALTREX ) 1000 MG tablet The patient discussed Dr Isobel refilling this for her at the last appointment   Has the patient contacted their pharmacy? Yes  (Agent: If no, request that the patient contact the pharmacy for the refill. If patient does not wish to contact the pharmacy document the reason why and proceed with request.) (Agent: If yes, when and what did the pharmacy advise?)  This is the patient's preferred pharmacy:  [facility] [address] [place] - [address] [place] [address] Phone: [phone] Fax: [phone]  Is this the correct pharmacy for this prescription? Yes  If no, delete pharmacy and type the correct one.   Has the prescription been filled recently? Yes   Is the patient out of the medication? No   Has the patient been seen for an appointment in the last year OR does the patient have an upcoming appointment? Yes  Can we respond through MyChart? No   Agent: Please be advised that Rx refills may take up to 3 business days. We ask that you follow-up with your pharmacy.

## 2024-07-19 MED ORDER — VALACYCLOVIR HCL 1 G PO TABS: 1000.0000 mg | ORAL_TABLET | Freq: Every day | ORAL | 2 refills | Status: AC

## 2024-07-25 ENCOUNTER — Telehealth: Payer: Self-pay | Admitting: *Deleted

## 2024-07-25 NOTE — Telephone Encounter (Signed)
 Return pt's call. She stated she moved from [place] ; she has been using CPAP machine over 10 yrs for sleep apnea. She wants to start Zepbound ( since she has sleep apnea) to lose weight; hx of spinal injury; stated she wants to get off of pain medicine. She called the office in [place] to fax her records. I informed her she will need an appt to discuss Zepbound; appt scheduled w/Dr Myrna on 12/15.

## 2024-07-25 NOTE — Telephone Encounter (Signed)
 Copied from CRM [id number]. Topic: General - Other >> Jul 25, 2024  3:31 PM Miquel SAILOR wrote: Reason for CRM: PT Requesting call back. On clarification on issue with spinal issue/Weight loss info/CPAP as will she is using. She is interested weight loss program or medication for weight. [phone]

## 2024-07-26 ENCOUNTER — Other Ambulatory Visit: Payer: Self-pay | Admitting: Student

## 2024-07-26 NOTE — Progress Notes (Unsigned)
ENT

## 2024-07-26 NOTE — Telephone Encounter (Signed)
 LMOM for the pt to rtn call as no results have been scanned into epic nor has a Referral been placed for an ENTspecialist at her last visit. PT has been asked to provide the name and location of where she had her hearing test completed.  Copied from CRM #[id number]. Topic: Referral - Question >> Jul 18, 2024  4:11 PM DeAngela L wrote: Reason for CRM: patient calling to ask if the doctor refer her to an ENT specialist after the patient had a hearing screening and the results where sent over to  Dr Isobel   Pt num [phone] [facility])   Patient prefer if the ENT specialist is in the [place] area

## 2024-08-07 ENCOUNTER — Other Ambulatory Visit (HOSPITAL_COMMUNITY): Payer: Self-pay

## 2024-08-07 ENCOUNTER — Ambulatory Visit (HOSPITAL_COMMUNITY): Admission: RE | Admit: 2024-08-07 | Discharge: 2024-08-07 | Attending: Surgery | Admitting: Surgery

## 2024-08-07 ENCOUNTER — Ambulatory Visit (INDEPENDENT_AMBULATORY_CARE_PROVIDER_SITE_OTHER)

## 2024-08-07 ENCOUNTER — Ambulatory Visit: Admitting: Surgery

## 2024-08-07 VITALS — BP 131/79 | HR 87 | Temp 97.7°F | Ht 63.0 in | Wt 200.2 lb

## 2024-08-07 DIAGNOSIS — G4733 Obstructive sleep apnea (adult) (pediatric): Secondary | ICD-10-CM

## 2024-08-07 DIAGNOSIS — R635 Abnormal weight gain: Secondary | ICD-10-CM

## 2024-08-07 DIAGNOSIS — Z87891 Personal history of nicotine dependence: Secondary | ICD-10-CM

## 2024-08-07 DIAGNOSIS — G8929 Other chronic pain: Secondary | ICD-10-CM

## 2024-08-07 DIAGNOSIS — D509 Iron deficiency anemia, unspecified: Secondary | ICD-10-CM

## 2024-08-07 DIAGNOSIS — R7303 Prediabetes: Secondary | ICD-10-CM

## 2024-08-07 DIAGNOSIS — Z6835 Body mass index (BMI) 35.0-35.9, adult: Secondary | ICD-10-CM | POA: Diagnosis not present

## 2024-08-07 DIAGNOSIS — I872 Venous insufficiency (chronic) (peripheral): Secondary | ICD-10-CM

## 2024-08-07 DIAGNOSIS — R0602 Shortness of breath: Secondary | ICD-10-CM | POA: Diagnosis not present

## 2024-08-07 DIAGNOSIS — Z0000 Encounter for general adult medical examination without abnormal findings: Secondary | ICD-10-CM

## 2024-08-07 DIAGNOSIS — E669 Obesity, unspecified: Secondary | ICD-10-CM

## 2024-08-07 DIAGNOSIS — M25569 Pain in unspecified knee: Secondary | ICD-10-CM

## 2024-08-07 DIAGNOSIS — I10 Essential (primary) hypertension: Secondary | ICD-10-CM

## 2024-08-07 DIAGNOSIS — E781 Pure hyperglyceridemia: Secondary | ICD-10-CM

## 2024-08-07 DIAGNOSIS — D500 Iron deficiency anemia secondary to blood loss (chronic): Secondary | ICD-10-CM

## 2024-08-07 NOTE — Progress Notes (Signed)
 CC: SOB  HPI:  [name] is a 68 year old female with a history of hypertension, stable angina, obstructive sleep apnea, obesity, fibromyalgia, osteoarthritis, anxiety/depression, and remote tobacco use who presents today for evaluation of progressive shortness of breath and chronic leg pain.  She reports that over the past approximately two years, her shortness of breath has gradually worsened and now significantly limits her daily activities. She becomes winded after walking less than 10 feet and must stop to rest. She denies chest pain, palpitations, dizziness, or syncope. She does not routinely experience orthopnea or paroxysmal nocturnal dyspnea and typically sleeps flat with minimal neck support. She has no known history of asthma or COPD and does not use inhalers. She has a 30 pack-year smoking history, quitting in 2011.  She also reports chronic leg pain, primarily involving the posterior leg and calf rather than the knee joint itself. She has known varicose veins and underwent a right-sided varicose vein procedure in September with partial but incomplete improvement. She has a remote history of a posterior knee cyst and has previously been evaluated by orthopedics, where imaging showed only mild osteoarthritis. A prior knee injection did not improve symptoms, as the pain does not feel intra-articular.  Earlier today, she underwent a venous ultrasound of the leg due to concern for possible clot, which showed no evidence of deep or superficial venous thrombosis and no flow obstruction, which was reviewed with the patient. Despite this, her leg pain and limited mobility persist.  She also has long-standing obstructive sleep apnea and is concerned that her weight may be contributing to her breathing difficulty, leg pain, and joint discomfort. She is motivated to pursue a comprehensive evaluation and consolidate her care within this system.  Past Medical History:  Diagnosis Date    ADHD 01/30/2020   Anxiety    Arthritis    Depression 01/30/2020   Elevated cholesterol    Fibromyalgia 01/30/2020   Healthcare maintenance 08/11/2020   Hypertension    Insomnia 10/21/2020   Murmur 09/23/2020   OSA (obstructive sleep apnea) 01/30/2020   Pneumonia    Stable angina 08/09/2020    Medications Ordered Prior to Encounter[1]  Family History  Problem Relation Age of Onset   Clotting disorder Mother    Heart disease Mother    Atrial fibrillation Mother    Heart disease Father    Kidney disease Maternal Grandmother    Diabetes Maternal Grandmother    Heart disease Paternal Grandmother    Heart disease Paternal Grandfather    Colon cancer Neg Hx    Esophageal cancer Neg Hx    Pancreatic cancer Neg Hx    Stomach cancer Neg Hx    Breast cancer Neg Hx     Social History   Socioeconomic History   Marital status: Married    Spouse name: Not on file   Number of children: Not on file   Years of education: Not on file   Highest education level: Not on file  Occupational History   Not on file  Tobacco Use   Smoking status: Former    Current packs/day: 0.00    Average packs/day: 1.5 packs/day for 20.0 years (30.0 ttl pk-yrs)    Types: Cigarettes    Start date: 08/24/1989    Quit date: 08/24/2009    Years since quitting: 14.9   Smokeless tobacco: Never  Vaping Use   Vaping status: Former  Substance and Sexual Activity   Alcohol  use: Yes    Alcohol /week: 2.0 standard  drinks of alcohol     Types: 1 Glasses of wine, 1 Cans of beer per week    Comment: 3 times per week   Drug use: Not on file   Sexual activity: Not on file  Other Topics Concern   Not on file  Social History Narrative   Not on file   Social Drivers of Health   Tobacco Use: Medium Risk (06/16/2024)   Patient History    Smoking Tobacco Use: Former    Smokeless Tobacco Use: Never    Passive Exposure: Not on file  Financial Resource Strain: Low Risk (04/26/2024)   Overall Financial Resource  Strain (CARDIA)    Difficulty of Paying Living Expenses: Not very hard  Food Insecurity: No Food Insecurity (04/26/2024)   Epic    Worried About Radiation Protection Practitioner of Food in the Last Year: Never true    Ran Out of Food in the Last Year: Never true  Transportation Needs: No Transportation Needs (04/26/2024)   Epic    Lack of Transportation (Medical): No    Lack of Transportation (Non-Medical): No  Physical Activity: Sufficiently Active (04/26/2024)   Exercise Vital Sign    Days of Exercise per Week: 5 days    Minutes of Exercise per Session: 30 min  Stress: No Stress Concern Present (04/26/2024)   [employer] of Occupational Health - Occupational Stress Questionnaire    Feeling of Stress: Not at all  Social Connections: Socially Integrated (04/26/2024)   Social Connection and Isolation Panel    Frequency of Communication with Friends and Family: More than three times a week    Frequency of Social Gatherings with Friends and Family: Twice a week    Attends Religious Services: More than 4 times per year    Active Member of Clubs or Organizations: Yes    Attends Banker Meetings: More than 4 times per year    Marital Status: Married  Catering Manager Violence: Not At Risk (04/26/2024)   Epic    Fear of Current or Ex-Partner: No    Emotionally Abused: No    Physically Abused: No    Sexually Abused: No  Depression (PHQ2-9): High Risk (06/16/2024)   Depression (PHQ2-9)    PHQ-2 Score: 14  Alcohol  Screen: Low Risk (04/26/2024)   Alcohol  Screen    Last Alcohol  Screening Score (AUDIT): 1  Housing: Low Risk (04/26/2024)   Epic    Unable to Pay for Housing in the Last Year: No    Number of Times Moved in the Last Year: 0    Homeless in the Last Year: No  Utilities: Not At Risk (04/26/2024)   Epic    Threatened with loss of utilities: No  Health Literacy: Adequate Health Literacy (04/26/2024)   B1300 Health Literacy    Frequency of need for help with medical instructions: Never     Review of Systems: ROS negative except for what is noted on the assessment and plan.  Vitals:   08/07/24 1511  BP: 131/79  Pulse: 87  Temp: 97.7 F (36.5 C)  TempSrc: Oral  SpO2: 97%  Weight: 200 lb 3.2 oz (90.8 kg)  Height: 5' 3 (1.6 m)    Physical Exam Physical Exam: Constitutional: Well-appearing, sitting comfortably in chair, in no acute distress. HENT: Normocephalic, atraumatic; mucous membranes moist. Eyes: Conjunctiva non-erythematous. Neck: Supple. Cardiovascular: Regular rate and rhythm; systolic murmur heard best at the left sternal border. Pulmonary/Chest: Normal work of breathing on room air; lungs clear to auscultation bilaterally. Abdominal: Soft,  non-tender, non-distended. Musculoskeletal: No gross deformities. Full range of motion in all extremities. Left lower extremity with tenderness and prominence at the posterior knee without overlying redness, warmth, or localized swelling. Patient reports venous ultrasound today negative for DVT or superficial thrombosis. Neurological: Alert and oriented 3; 5/5 strength in bilateral upper and lower extremities; normal gait. Skin: Warm and dry. Psych: Appropriate mood and affect.  Assessment & Plan:    #Dyspnea on exertion, progressive Worsening over approximately two years and now significantly limiting ambulation. No chest pain, orthopnea, or syncope. Exam notable for systolic murmur at the left sternal border. Given history of stable angina, hypertension, obstructive sleep apnea, obesity, and remote tobacco use, differential includes cardiac etiology (structural or valvular disease, ischemia), pulmonary disease, obesity related hypoventilation. Prior echocardiogram approximately three years ago with EF: 60 percent and mild LV hypertrophy and mil MR. Requires updated cardiac evaluation in the setting of progressive exertional symptoms. Cardiology referral placed for further evaluation.Given smoking history, low-dose CT  scan for lung cancer screening was ordered.  #Obesity Clinically significant obesity is likely playing an important role in the patients shortness of breath with activity, reduced stamina, joint and leg pain, and worsening obstructive sleep apnea. The patient is motivated to work on weight loss in hopes of improving her breathing, mobility, and overall quality of life. We discussed weight management options, including lifestyle changes and medication therapy. Prior authorization for a GLP-1 agonist (Zepbound) was attempted using her diagnosis of obstructive sleep apnea; however, coverage was denied by both Medicare and BCBS. We will revisit treatment options after completion of her cardiopulmonary evaluation.  #Remote tobacco use / Lung cancer screening Former smoker with approximately 30 pack-year history, quit 10 years ago. Given smoking history, low-dose CT scan for lung cancer screening was ordered.   #Chronic left lower extremity pain Posterior knee and calf discomfort. No erythema, warmth, or swelling on exam. Venous ultrasound performed today negative for deep or superficial venous thrombosis, which is reassuring. Etiology remains unclear; differential includes venous insufficiency, musculoskeletal pathology, or cyst recurrence.   Patient seen with Dr. Lovie Armando Rossetti M.D [facility] Internal Medicine, PGY-1 Phone: [phone] Date 08/07/2024 Time 4:37 PM     [1]  Current Outpatient Medications on File Prior to Visit  Medication Sig Dispense Refill   carboxymethylcellulose (REFRESH PLUS) 0.5 % SOLN Place 1 drop into both eyes daily.     COLLAGEN PO Take 2 capsules by mouth daily.     diclofenac  Sodium (VOLTAREN  ARTHRITIS PAIN) 1 % GEL Apply 4 g topically 4 (four) times daily. 100 g 0   DULoxetine  (CYMBALTA ) 60 MG capsule Take 1 capsule by mouth once daily 60 capsule 3   fluorometholone  (FML) 0.1 % ophthalmic ointment 1 application  4 (four) times daily as needed (dry/itchy  eyes).     furosemide  (LASIX ) 20 MG tablet Take 1 tablet (20 mg total) by mouth daily. 30 tablet 11   LORazepam  (ATIVAN ) 1 MG tablet Take 1 tablet 30 to 60 minutes prior to leaving house on day of office surgery. Bring second tablet with you to office on day of office surgery. 2 tablet 0   methocarbamol  (ROBAXIN ) 750 MG tablet Take 1 tablet (750 mg total) by mouth every 6 (six) hours as needed for muscle spasms. 30 tablet 2   Multiple Vitamins-Minerals (MULTIVITAMIN WITH MINERALS) tablet Take 1 tablet by mouth daily.     nitroGLYCERIN  (NITROSTAT ) 0.4 MG SL tablet Place 1 tablet (0.4 mg total) under the tongue every  5 (five) minutes as needed for chest pain. 5 tablet 1   olmesartan  (BENICAR ) 20 MG tablet Take 1/2 (one-half) tablet by mouth once daily 45 tablet 3   OVER THE COUNTER MEDICATION Take 12 mg by mouth at bedtime. CBD 12 mg XITE with Delta 9 (9 mg)     OVER THE COUNTER MEDICATION 1 Package daily. Activate C - immune complex     oxyCODONE -acetaminophen  (PERCOCET/ROXICET) 5-325 MG tablet Take 1 tablet by mouth 2 (two) times daily as needed.     pregabalin  (LYRICA ) 100 MG capsule Take 100 mg by mouth 2 (two) times daily.     valACYclovir  (VALTREX ) 1000 MG tablet Take 1 tablet (1,000 mg total) by mouth daily. 90 tablet 2   Vitamin D-Vitamin K (K2 PLUS D3 PO) Take 1 tablet by mouth daily.     No current facility-administered medications on file prior to visit.

## 2024-08-07 NOTE — Patient Instructions (Addendum)
 Thank you, Ms. Lynsay Labruyere-May, for allowing us  to provide your care today.  Today, we spent time talking through your main concerns, including shortness of breath with even minimal activity, ongoing leg pain, long-standing sleep apnea, and weight-related symptoms that are affecting your daily life. We discussed that your breathing symptoms have been gradually worsening over time and that it is important to look at the possible causes in an organized way, including your heart, lungs, and overall health.  For this reason, we placed a referral to Cardiology so your heart function can be reviewed, especially since your last echocardiogram was several years ago. We also ordered a low-dose CT scan for lung cancer screening, based on your past smoking history, to help evaluate your lungs and rule out concerning causes of shortness of breath. These tests are important steps to make sure nothing serious is being missed and to guide the best next steps in your care.  We also reviewed the recent ultrasound of your leg, which was reassuring in that it did not show any blood clots. While this rules out a dangerous cause of leg pain, we discussed that further evaluation is still needed to better understand what is contributing to your pain and limited mobility.  Our goal is to bring all of your care together, identify the underlying reasons for your symptoms, and create a plan that helps improve your breathing, mobility, comfort, and overall quality of life..    I have ordered the following labs for you:  Lab Orders         Lipid Profile         TSH         T4, Free         CBC with Diff         Hemoglobin A1c      Tests ordered today: Lab Orders         Lipid Profile         TSH         T4, Free         CBC with Diff         Hemoglobin A1c       Referrals ordered today:   Referral Orders         Ambulatory Referral for Lung Cancer Screening [[id number]]         Ambulatory referral to Cardiology       I have ordered the following medication/changed the following medications:   Stop the following medications: There are no discontinued medications.   Start the following medications: No orders of the defined types were placed in this encounter.    Follow up: 4-6 months   Remember:   Should you have any questions or concerns please call the internal medicine clinic at [phone].    Armando Rossetti, M.D [facility]

## 2024-08-08 ENCOUNTER — Ambulatory Visit: Payer: Self-pay

## 2024-08-08 DIAGNOSIS — D508 Other iron deficiency anemias: Secondary | ICD-10-CM

## 2024-08-08 LAB — LIPID PANEL
Chol/HDL Ratio: 2.9 ratio (ref 0.0–4.4)
Cholesterol, Total: 194 mg/dL (ref 100–199)
HDL: 68 mg/dL (ref >39)
LDL Chol Calc (NIH): 83 mg/dL (ref 0–99)
Triglycerides: 266 mg/dL — ABNORMAL HIGH (ref 0–149)
VLDL Cholesterol Cal: 43 mg/dL — ABNORMAL HIGH (ref 5–40)

## 2024-08-08 LAB — CBC WITH DIFFERENTIAL/PLATELET
Basophils Absolute: 0.1 x10E3/uL (ref 0.0–0.2)
Basos: 1 %
EOS (ABSOLUTE): 0.2 x10E3/uL (ref 0.0–0.4)
Eos: 4 %
Hematocrit: 23.1 % — ABNORMAL LOW (ref 34.0–46.6)
Hemoglobin: 7 g/dL — CL (ref 11.1–15.9)
Immature Grans (Abs): 0 x10E3/uL (ref 0.0–0.1)
Immature Granulocytes: 0 %
Lymphocytes Absolute: 1.2 x10E3/uL (ref 0.7–3.1)
Lymphs: 23 %
MCH: 24.5 pg — ABNORMAL LOW (ref 26.6–33.0)
MCHC: 30.3 g/dL — ABNORMAL LOW (ref 31.5–35.7)
MCV: 81 fL (ref 79–97)
Monocytes Absolute: 0.4 x10E3/uL (ref 0.1–0.9)
Monocytes: 8 %
Neutrophils Absolute: 3.4 x10E3/uL (ref 1.4–7.0)
Neutrophils: 64 %
Platelets: 337 x10E3/uL (ref 150–450)
RBC: 2.86 x10E6/uL — ABNORMAL LOW (ref 3.77–5.28)
RDW: 14.4 % (ref 11.7–15.4)
WBC: 5.3 x10E3/uL (ref 3.4–10.8)

## 2024-08-08 LAB — TSH: TSH: 0.959 u[IU]/mL (ref 0.450–4.500)

## 2024-08-08 LAB — T4, FREE: Free T4: 0.94 ng/dL (ref 0.82–1.77)

## 2024-08-08 LAB — HEMOGLOBIN A1C
Est. average glucose Bld gHb Est-mCnc: 123 mg/dL
Hgb A1c MFr Bld: 5.9 % — ABNORMAL HIGH (ref 4.8–5.6)

## 2024-08-08 NOTE — Addendum Note (Signed)
 Addended by: Matteson Blue on: 08/08/2024 09:43 AM   Modules accepted: Orders

## 2024-08-08 NOTE — Progress Notes (Signed)
 Low hemoglobin noted. Additional laboratory tests (ferritin, TIBC, and iron studies) were added to yesterdays blood sample. The case was discussed with Dr. Jeanelle. I attempted to contact the patient twice to review the results and discuss possible treatment options, including blood transfusion, IV iron, oral iron supplementation, and evaluation for occult bleeding. The patient did not answer, and voicemail was not available. Will attempt to reach the patient again.

## 2024-08-09 LAB — SPECIMEN STATUS REPORT

## 2024-08-09 LAB — FERRITIN: Ferritin: 8 ng/mL — ABNORMAL LOW (ref 15–150)

## 2024-08-09 LAB — IRON AND TIBC
Iron Saturation: 3 % — CL (ref 15–55)
Iron: 14 ug/dL — ABNORMAL LOW (ref 27–139)
Total Iron Binding Capacity: 445 ug/dL (ref 250–450)
UIBC: 431 ug/dL — ABNORMAL HIGH (ref 118–369)

## 2024-08-09 NOTE — Progress Notes (Signed)
 Called the patient to inform her of the low ferritin level and to discuss possible treatment options. There was no answer; a voicemail message was left.

## 2024-08-09 NOTE — Progress Notes (Signed)
 Internal Medicine Clinic Attending  I was physically present during the key portions of the resident provided service and participated in the medical decision making of patient's management care. I reviewed pertinent patient test results.  The assessment, diagnosis, and plan were formulated together and I agree with the documentation in the resident's note.  Lovie Clarity, MD    I think her chronic, progressive dyspnea is likely related to her iron deficiency anemia, with Hgb at 7.0.  I appreciate Dr. Mikel multiple attempts to contact the patient regarding this critical finding

## 2024-08-22 ENCOUNTER — Telehealth: Payer: Self-pay | Admitting: *Deleted

## 2024-08-22 ENCOUNTER — Telehealth: Payer: Self-pay | Admitting: Acute Care

## 2024-08-22 DIAGNOSIS — F17210 Nicotine dependence, cigarettes, uncomplicated: Secondary | ICD-10-CM

## 2024-08-22 DIAGNOSIS — Z122 Encounter for screening for malignant neoplasm of respiratory organs: Secondary | ICD-10-CM

## 2024-08-22 DIAGNOSIS — Z87891 Personal history of nicotine dependence: Secondary | ICD-10-CM

## 2024-08-22 NOTE — Telephone Encounter (Signed)
 Lung Cancer Screening Narrative/Criteria Questionnaire (Cigarette Smokers Only- No Cigars/Pipes/vapes)   Elie Labruyere-May   SDMV:08/30/24@930a [facility]                                           May 14, 1956                LDCT: 09/01/24@930a /[facility]    68 y.o.   Phone: [phone]  Lung Screening Narrative (confirm age 5-77 yrs Medicare / 50-80 yrs Private pay insurance)   Insurance information:[employer]   Referring Provider:[place]   This screening involves an initial phone call with a team member from our program. It is called a shared decision making visit. The initial meeting is required by insurance and Medicare to make sure you understand the program. This appointment takes about 15-20 minutes to complete. The CT scan will completed at a separate date/time. This scan takes about 5-10 minutes to complete and you may eat and drink before and after the scan.  Criteria questions for Lung Cancer Screening:   Are you a current or former smoker? Former Age began smoking: 15y   If you are a former smoker, what year did you quit smoking? 2011 -add 3 years additional    To calculate your smoking history, I need an accurate estimate of how many packs of cigarettes you smoked per day and for how many years. (Not just the number of PPD you are now smoking)   Years smoking 35 x Packs per day 1.5 to 2 = Pack years 61  (at least 20 pack yrs)   (Make sure they understand that we need to know how much they have smoked in the past, not just the number of PPD they are smoking now)  Do you have a personal history of cancer?  No    Do you have a family history of cancer? No  Are you coughing up blood?  No  Have you had unexplained weight loss of 15 lbs or more in the last 6 months? No  It looks like you meet all criteria.     Additional information: N/A

## 2024-08-22 NOTE — Telephone Encounter (Signed)
 Will forward to Dr. Azadegan and the [facility] Team                                                  Copied from CRM [id number]. Topic: Clinical - Lab/Test Results >> Aug 22, 2024 11:36 AM Graeme ORN wrote: Reason for CRM: Patient returned missed call to discuss lab results and treatment options. Called [facility] - provider not currently in office. Advised patient she will receive a call back from clinician. Thank You

## 2024-08-28 ENCOUNTER — Telehealth: Payer: Self-pay | Admitting: *Deleted

## 2024-08-28 ENCOUNTER — Ambulatory Visit (HOSPITAL_COMMUNITY)

## 2024-08-28 ENCOUNTER — Ambulatory Visit: Admitting: Surgery

## 2024-08-28 NOTE — Telephone Encounter (Signed)
 Not a patient of [employer]. Forwarded back to [place], Graeme Ill.

## 2024-08-28 NOTE — Telephone Encounter (Signed)
 Copied from CRM #[id number]. Topic: Clinical - Lab/Test Results >> Aug 22, 2024 11:36 AM Graeme ORN wrote: Reason for CRM: Patient returned missed call to discuss lab results and treatment options. Called [facility] - provider not currently in office. Advised patient she will receive a call back from clinician. Thank You >> Aug 25, 2024  4:30 PM Chiquita SQUIBB wrote: Patient is calling in regarding the lab results, [facility] was already closed for the day. Please contact the patient back on Monday to discuss the options.

## 2024-08-30 ENCOUNTER — Telehealth: Payer: Self-pay | Admitting: *Deleted

## 2024-08-30 ENCOUNTER — Ambulatory Visit (INDEPENDENT_AMBULATORY_CARE_PROVIDER_SITE_OTHER): Admitting: *Deleted

## 2024-08-30 DIAGNOSIS — Z87891 Personal history of nicotine dependence: Secondary | ICD-10-CM

## 2024-08-30 NOTE — Progress Notes (Signed)
" °  Virtual Visit via Telephone Note  I connected with Alexa Rodriguez on 08/30/2024 at  9:30 AM EST by telephone and verified that I am speaking with the correct person using two identifiers.  Location: Patient: at home Provider: [address] [place], [place], [address]    I discussed the limitations, risks, security and privacy concerns of performing an evaluation and management service by telephone and the availability of in person appointments. I also discussed with the patient that there may be a patient responsible charge related to this service. The patient expressed understanding and agreed to proceed.   Shared Decision Making Visit Lung Cancer Screening Program [phone])   Eligibility: Age 69 y.o. Pack Years Smoking History Calculation 65 (# packs/per year x # years smoked) Recent History of coughing up blood  no Unexplained weight loss? no ( >Than 15 pounds within the last 6 months ) Prior History Lung / other cancer no (Diagnosis within the last 5 years already requiring surveillance chest CT Scans). Smoking Status Former Smoker Former Smokers: Years since quit: 14 years  Quit Date: 2011  Visit Components: Discussion included one or more decision making aids. yes Discussion included risk/benefits of screening. yes Discussion included potential follow up diagnostic testing for abnormal scans. yes Discussion included meaning and risk of over diagnosis. yes Discussion included meaning and risk of False Positives. yes Discussion included meaning of total radiation exposure. yes  Counseling Included: Importance of adherence to annual lung cancer LDCT screening. yes Impact of comorbidities on ability to participate in the program. yes Ability and willingness to under diagnostic treatment. yes  Smoking Cessation Counseling: Current Smokers:  Discussed importance of smoking cessation. yes Information about tobacco cessation classes and interventions provided to  patient. yes Patient provided with ticket for LDCT Scan. no Symptomatic Patient. no  Counseling(Intermediate counseling: > three minutes) 99406 Diagnosis Code: Tobacco Use Z72.0 Asymptomatic Patient no  Counseling (Intermediate counseling: > three minutes counseling) H9563 Former Smokers:  Discussed the importance of maintaining cigarette abstinence. yes Diagnosis Code: Personal History of Nicotine Dependence. S12.108 Information about tobacco cessation classes and interventions provided to patient. yes Patient provided with ticket for LDCT Scan. no Written Order for Lung Cancer Screening with LDCT placed in Epic. Yes (CT Chest Lung Cancer Screening Low Dose W/O CM) [id number] Z12.2-Screening of respiratory organs Z87.891-Personal history of nicotine dependence   Alexa Speaks, RN       "

## 2024-08-30 NOTE — Telephone Encounter (Signed)
 Copied from CRM [id number]. Topic: Clinical - Lab/Test Results >> Aug 30, 2024 12:06 PM Alexa Rodriguez wrote: Reason for CRM: Patient states she received a call from the doctor stating that she needed to speak with her about some test/lab results. Please give patient a call back. CB #: [id number].

## 2024-08-30 NOTE — Patient Instructions (Signed)

## 2024-08-31 NOTE — Telephone Encounter (Signed)
 Patient is returning call, transferred to [facility].

## 2024-09-01 ENCOUNTER — Ambulatory Visit (INDEPENDENT_AMBULATORY_CARE_PROVIDER_SITE_OTHER)
Admission: RE | Admit: 2024-09-01 | Discharge: 2024-09-01 | Disposition: A | Discharge status: Home / Self Care | Source: Ambulatory Visit | Attending: Family Medicine | Admitting: Family Medicine

## 2024-09-01 DIAGNOSIS — F17210 Nicotine dependence, cigarettes, uncomplicated: Secondary | ICD-10-CM

## 2024-09-01 DIAGNOSIS — Z87891 Personal history of nicotine dependence: Secondary | ICD-10-CM

## 2024-09-01 DIAGNOSIS — Z122 Encounter for screening for malignant neoplasm of respiratory organs: Secondary | ICD-10-CM

## 2024-09-01 DIAGNOSIS — D509 Iron deficiency anemia, unspecified: Secondary | ICD-10-CM | POA: Insufficient documentation

## 2024-09-04 NOTE — Telephone Encounter (Signed)
 Per the message below the [facility] office has been trying to reach the patient with no response:   Contacted pt to schedule an appt. Unable to reach via phone, voicemail was left.  The Pt will need to call the following number if any questions to be sch at their location:  [facility]  Cancer treatment center in [place], [place]  Address: [address], [place], [place] [address]  Phone: [phone]   Copied from CRM [id number]. Topic: Referral - Request for Referral >> Sep 01, 2024 10:26 AM Mercer PEDLAR wrote: Did the patient discuss referral with their provider in the last year? Yes (If No - schedule appointment) (If Yes - send message)  Appointment offered? No  Type of order/referral and detailed reason for visit: Patient stated that she was advised by Maryam Azadegan, MD to get an iron  infusion after getting her lab results back. 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 Patient is requesting a referral for this reason.   Preference of office, provider, location: [facility] [address], [place], [place] [address]  If referral order, have you been seen by this specialty before? No (If Yes, this issue or another issue? When? Where?  Can we respond through MyChart? No

## 2024-09-06 ENCOUNTER — Inpatient Hospital Stay: Admitting: Hematology and Oncology

## 2024-09-06 ENCOUNTER — Other Ambulatory Visit: Payer: Self-pay | Admitting: Hematology and Oncology

## 2024-09-06 ENCOUNTER — Inpatient Hospital Stay: Attending: Hematology and Oncology

## 2024-09-06 DIAGNOSIS — D509 Iron deficiency anemia, unspecified: Secondary | ICD-10-CM | POA: Insufficient documentation

## 2024-09-06 LAB — IRON AND TIBC
Iron: 23 ug/dL — ABNORMAL LOW (ref 28–170)
Saturation Ratios: 5 % — ABNORMAL LOW (ref 10.4–31.8)
TIBC: 518 ug/dL — ABNORMAL HIGH (ref 250–450)
UIBC: 495 ug/dL

## 2024-09-06 LAB — CBC WITH DIFFERENTIAL (CANCER CENTER ONLY)
Abs Immature Granulocytes: 0.01 K/uL (ref 0.00–0.07)
Basophils Absolute: 0 K/uL (ref 0.0–0.1)
Basophils Relative: 1 %
Eosinophils Absolute: 0.1 K/uL (ref 0.0–0.5)
Eosinophils Relative: 3 %
HCT: 25.6 % — ABNORMAL LOW (ref 36.0–46.0)
Hemoglobin: 7.3 g/dL — ABNORMAL LOW (ref 12.0–15.0)
Immature Granulocytes: 0 %
Lymphocytes Relative: 24 %
Lymphs Abs: 1.2 K/uL (ref 0.7–4.0)
MCH: 22.1 pg — ABNORMAL LOW (ref 26.0–34.0)
MCHC: 28.5 g/dL — ABNORMAL LOW (ref 30.0–36.0)
MCV: 77.6 fL — ABNORMAL LOW (ref 80.0–100.0)
Monocytes Absolute: 0.3 K/uL (ref 0.1–1.0)
Monocytes Relative: 6 %
Neutro Abs: 3.3 K/uL (ref 1.7–7.7)
Neutrophils Relative %: 66 %
Platelet Count: 278 K/uL (ref 150–400)
RBC: 3.3 MIL/uL — ABNORMAL LOW (ref 3.87–5.11)
RDW: 16.1 % — ABNORMAL HIGH (ref 11.5–15.5)
WBC Count: 5 K/uL (ref 4.0–10.5)
nRBC: 0 % (ref 0.0–0.2)

## 2024-09-06 LAB — CMP (CANCER CENTER ONLY)
ALT: 19 U/L (ref 0–44)
AST: 25 U/L (ref 15–41)
Albumin: 4.7 g/dL (ref 3.5–5.0)
Alkaline Phosphatase: 86 U/L (ref 38–126)
Anion gap: 13 (ref 5–15)
BUN: 17 mg/dL (ref 8–23)
CO2: 24 mmol/L (ref 22–32)
Calcium: 9.7 mg/dL (ref 8.9–10.3)
Chloride: 104 mmol/L (ref 98–111)
Creatinine: 0.75 mg/dL (ref 0.44–1.00)
GFR, Estimated: >60 mL/min
Glucose, Bld: 100 mg/dL — ABNORMAL HIGH (ref 70–99)
Potassium: 3.7 mmol/L (ref 3.5–5.1)
Sodium: 140 mmol/L (ref 135–145)
Total Bilirubin: 0.6 mg/dL (ref 0.0–1.2)
Total Protein: 6.8 g/dL (ref 6.5–8.1)

## 2024-09-06 LAB — VITAMIN B12: Vitamin B-12: 530 pg/mL (ref 180–914)

## 2024-09-06 LAB — SAMPLE TO BLOOD BANK

## 2024-09-06 LAB — FOLATE: Folate: >20 ng/mL

## 2024-09-06 LAB — FERRITIN: Ferritin: 7 ng/mL — ABNORMAL LOW (ref 11–307)

## 2024-09-08 ENCOUNTER — Other Ambulatory Visit: Payer: Self-pay | Admitting: Medical Genetics

## 2024-09-08 ENCOUNTER — Inpatient Hospital Stay: Admitting: Hematology and Oncology

## 2024-09-08 ENCOUNTER — Encounter: Payer: Self-pay | Admitting: Hematology and Oncology

## 2024-09-08 ENCOUNTER — Other Ambulatory Visit: Payer: Self-pay

## 2024-09-08 ENCOUNTER — Inpatient Hospital Stay

## 2024-09-08 VITALS — BP 138/63 | HR 64 | Temp 98.1°F | Resp 20 | Ht 63.0 in | Wt 190.7 lb

## 2024-09-08 DIAGNOSIS — D509 Iron deficiency anemia, unspecified: Secondary | ICD-10-CM | POA: Diagnosis not present

## 2024-09-08 DIAGNOSIS — Z803 Family history of malignant neoplasm of breast: Secondary | ICD-10-CM

## 2024-09-08 DIAGNOSIS — Z8041 Family history of malignant neoplasm of ovary: Secondary | ICD-10-CM

## 2024-09-08 MED ORDER — SODIUM CHLORIDE 0.9% FLUSH: 10.0000 mL | Freq: Once | INTRAVENOUS | Status: DC | PRN

## 2024-09-08 MED ORDER — IRON SUCROSE 20 MG/ML IV SOLN
200.0000 mg | Freq: Once | INTRAVENOUS | Status: AC
  Administered 2024-09-08: 200 mg via INTRAVENOUS
  Filled 2024-09-08: qty 10

## 2024-09-08 NOTE — Patient Instructions (Signed)

## 2024-09-08 NOTE — Progress Notes (Unsigned)
 " [facility] [address] [place],  [place]  [address] [phone]  Clinic Day:  09/08/2024   Referring physician: Amilibia, Jaden, DO  Patient Care Team: Patient Care Team: Amilibia, Jaden, DO as PCP - General (Internal Medicine) Tobb, Kardie, DO as PCP - Cardiology (Cardiology) [facility], [place] as Consulting Physician (Optometry)   REASON FOR CONSULTATION:  Iron  deficiency anemia  HISTORY OF PRESENT ILLNESS:   Alexa Rodriguez is a 69 y.o. female iron  deficiency anemia who is referred in consultation by Dr. Jaden Amilibia for assessment and management. The patient was seen on December 15 for progressive shortness of breath.  CBC revealed hemoglobin of 7 with an MCV of 81, WBCs 5.3, 64% neutrophils, 23% lymphocytes, 8% monocytes, 4% eosinophils and 1% basophils, platelets 337,000.  Iron  studies revealed serum iron  14, TIBC 445, iron  saturation 3% and ferritin 8.  Review of her records reveals she was evaluated for diarrhea in August 2023 and was anemic at that time with a hemoglobin of 10.7, MCV 89, normal white count and platelets.  Iron  studies revealed serum iron  74, TIBC 293, iron  saturation 18 and ferritin 11.  B12 was low.  She states she was not started on B12. Colonoscopy in November 2023 revealed nonbleeding internal hemorrhoids, multiple medium mouth diverticula and a sessile polyp which was removed.  Pathology revealed a tubular adenoma.   The patient reports severe fatigue, shortness of breath with exertion, headaches and leg pain.  She reports pica to ice.  She reports occasional hemorrhoidal bleeding and vaginal spotting, but no gross bleeding.  She does use ibuprofen.  She denies donating blood.  She states she takes a prenatal vitamin with iron , but was not started on oral iron  supplement.  She states her diet is well-balanced.  She states she has been iron  deficient on and off over her lifetime, but has never had IV iron .  She refuses blood  transfusions.  Past Medical History:  Hypertension, fibromyalgia, arthritis, depression/anxiety, obstructive sleep apnea-uses CPAP, genital herpes.  History of pericarditis, hyperlipidemia.  Status post spinal stimulator placement, benign left breast lumpectomy, cesarean section x 2, right knee replacement, hysterectomy-ovaries are intact, left foot surgery, tonsillectomy, wisdom tooth extraction.  She states she is up-to-date on screening mammogram.  Social History: She is a former smoker.  Smoking included cigarettes 1 to 1-1/2 packs/day for 38 years.  She quit in 2011.  She reports previous heavy alcohol  use with 4-5 drinks daily for about 10 years.  She stopped drinking heavily 15 to 20 years ago.  She occasionally drinks alcohol  at this time.  She denies other substance use.  She was born and raised in [place]  moved to [place] .  She has been in [place] for about 5 years having remarried.  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  She has 2 adult children from a previous marriage, 2 grandchildren and 2 step grandchildren.  Family History: Maternal aunts had breast cancer in her 62s and ovarian cancer at uncertain age.  There is no other known family history of malignancy.   REVIEW OF SYSTEMS:   Review of Systems  Constitutional:  Positive for fatigue. Negative for appetite change, chills, fever and unexpected weight change.  HENT:   Negative for lump/mass, mouth sores and sore throat.   Respiratory:  Positive for shortness of breath. Negative for cough.   Cardiovascular:  Negative  for chest pain and leg swelling.  Gastrointestinal:  Negative for abdominal pain, constipation, diarrhea, nausea and vomiting.  Endocrine: Negative for hot flashes.  Genitourinary:  Negative for difficulty urinating, dysuria, frequency and hematuria.   Musculoskeletal:  Positive for back pain and myalgias (leg pain).  Negative for arthralgias.  Skin:  Negative for rash.  Neurological:  Positive for headaches. Negative for dizziness.  Hematological:  Negative for adenopathy. Does not bruise/bleed easily.  Psychiatric/Behavioral:  Negative for depression and sleep disturbance. The patient is not nervous/anxious.      VITALS:   Blood pressure 138/63, pulse 64, temperature 98.1 F (36.7 C), temperature source Oral, resp. rate 20, height 5' 3 (1.6 m), weight 190 lb 11.2 oz (86.5 kg), SpO2 95%.  Wt Readings from Last 3 Encounters:  09/08/24 190 lb 11.2 oz (86.5 kg)  08/07/24 200 lb 3.2 oz (90.8 kg)  06/16/24 196 lb 6.4 oz (89.1 kg)    Body mass index is 33.78 kg/m.  Performance status (ECOG): 1 - Symptomatic but completely ambulatory  PHYSICAL EXAM:   Physical Exam Vitals and nursing note reviewed.  Constitutional:      General: She is not in acute distress.    Appearance: Normal appearance.  HENT:     Head: Normocephalic and atraumatic.     Mouth/Throat:     Mouth: Mucous membranes are moist.     Pharynx: Oropharynx is clear. No oropharyngeal exudate or posterior oropharyngeal erythema.  Eyes:     General: No scleral icterus.    Extraocular Movements: Extraocular movements intact.     Conjunctiva/sclera: Conjunctivae normal.     Pupils: Pupils are equal, round, and reactive to light.  Cardiovascular:     Rate and Rhythm: Normal rate and regular rhythm.     Heart sounds: Normal heart sounds. No murmur heard.    No friction rub. No gallop.  Pulmonary:     Effort: Pulmonary effort is normal.     Breath sounds: Normal breath sounds. No wheezing, rhonchi or rales.  Abdominal:     General: There is no distension.     Palpations: Abdomen is soft. There is no hepatomegaly, splenomegaly or mass.     Tenderness: There is no abdominal tenderness.  Musculoskeletal:        General: Normal range of motion.     Cervical back: Normal range of motion and neck supple. No tenderness.     Right lower  leg: No edema.     Left lower leg: No edema.  Lymphadenopathy:     Cervical: No cervical adenopathy.     Upper Body:     Right upper body: No supraclavicular or axillary adenopathy.     Left upper body: No supraclavicular or axillary adenopathy.     Lower Body: No right inguinal adenopathy. No left inguinal adenopathy.  Skin:    General: Skin is warm and dry.     Coloration: Skin is not jaundiced.     Findings: No rash.  Neurological:     Mental Status: She is alert and oriented to person, place, and time.     Cranial Nerves: No cranial nerve deficit.  Psychiatric:        Mood and Affect: Mood normal.        Behavior: Behavior normal.        Thought Content: Thought content normal.      LABS:      Latest Ref Rng & Units 09/06/2024    3:22 PM 08/07/2024  4:34 PM 04/22/2022   12:05 PM  CBC  WBC 4.0 - 10.5 K/uL 5.0  5.3  4.1   Hemoglobin 12.0 - 15.0 g/dL 7.3  7.0  89.2   Hematocrit 36.0 - 46.0 % 25.6  23.1  31.9   Platelets 150 - 400 K/uL 278  337  214.0       Latest Ref Rng & Units 09/06/2024    3:22 PM 10/25/2023   11:20 AM 04/22/2022   12:05 PM  CMP  Glucose 70 - 99 mg/dL 899  87  92   BUN 8 - 23 mg/dL 17  20  24    Creatinine 0.44 - 1.00 mg/dL 9.24  9.34  9.36   Sodium 135 - 145 mmol/L 140  146  142   Potassium 3.5 - 5.1 mmol/L 3.7  4.6  4.3   Chloride 98 - 111 mmol/L 104  108  111   CO2 22 - 32 mmol/L 24  22  23    Calcium  8.9 - 10.3 mg/dL 9.7  9.7  8.8   Total Protein 6.5 - 8.1 g/dL 6.8   6.4   Total Bilirubin 0.0 - 1.2 mg/dL 0.6   0.5   Alkaline Phos 38 - 126 U/L 86   79   AST 15 - 41 U/L 25   17   ALT 0 - 44 U/L 19   14      Lab Results  Component Value Date   TIBC 518 (H) 09/06/2024   TIBC 445 08/07/2024   TIBC 410.2 04/22/2022   FERRITIN 7 (L) 09/06/2024   FERRITIN 8 (L) 08/07/2024   FERRITIN 11.1 04/22/2022   IRONPCTSAT 5 (L) 09/06/2024   IRONPCTSAT 3 (LL) 08/07/2024   IRONPCTSAT 18.0 (L) 04/22/2022   No results found for: LDH  STUDIES:    No results found.    HISTORY:   Past Medical History:  Diagnosis Date   ADHD 01/30/2020   Anxiety    Arthritis    Depression 01/30/2020   Elevated cholesterol    Fibromyalgia 01/30/2020   Healthcare maintenance 08/11/2020   Hypertension    Insomnia 10/21/2020   Murmur 09/23/2020   OSA (obstructive sleep apnea) 01/30/2020   Pneumonia    Stable angina 08/09/2020    Past Surgical History:  Procedure Laterality Date   ACHILLES TENDON SURGERY Left    Cut off bone spurs from heel and released achilles tendon   BREAST LUMPECTOMY Left 1990   benign   CARDIAC CATHETERIZATION     2004 in [place]    CESAREAN SECTION     x2   JOINT REPLACEMENT     right knee   LASER ABLATION Right 05/04/2024   endovenous laser ablation right greater saphenous vein and 10-20 stab phlebectomies right leg   SPINAL CORD STIMULATOR IMPLANT     TONSILLECTOMY     TRANSFORAMINAL LUMBAR INTERBODY FUSION W/ MIS 1 LEVEL N/A 02/09/2022   Procedure: LUMBAR FOUR-FIVE MINIMALLY INVASIVE (MIS) TRANSFORAMINAL LUMBAR INTERBODY FUSION;  Surgeon: Cheryle Debby LABOR, MD;  Location: [facility] OR;  Service: Neurosurgery;  Laterality: N/A;   VAGINAL HYSTERECTOMY     WISDOM TOOTH EXTRACTION      Family History  Problem Relation Age of Onset   Clotting disorder Mother    Heart disease Mother    Atrial fibrillation Mother    Aneurysm Father        Aortic   Heart disease Father    Breast cancer Maternal Aunt    Ovarian cancer Maternal Aunt  Kidney disease Maternal Grandmother    Diabetes Maternal Grandmother    Heart disease Paternal Grandmother    Heart disease Paternal Grandfather    Colon cancer Neg Hx    Esophageal cancer Neg Hx    Pancreatic cancer Neg Hx    Stomach cancer Neg Hx     Social History:  reports that she quit smoking about 15 years ago. Her smoking use included cigarettes. She started smoking about 53 years ago. She has a 66.5 pack-year smoking history. She has never used smokeless tobacco.  She reports that she does not currently use alcohol . She reports that she does not use drugs.The patient is alone today.  Allergies: Allergies[1]  Current Medications: Current Outpatient Medications  Medication Sig Dispense Refill   carboxymethylcellulose (REFRESH PLUS) 0.5 % SOLN Place 1 drop into both eyes daily.     COLLAGEN PO Take 2 capsules by mouth daily.     diclofenac  Sodium (VOLTAREN  ARTHRITIS PAIN) 1 % GEL Apply 4 g topically 4 (four) times daily. 100 g 0   DULoxetine  (CYMBALTA ) 60 MG capsule Take 1 capsule by mouth once daily 60 capsule 3   fluorometholone  (FML) 0.1 % ophthalmic ointment 1 application  4 (four) times daily as needed (dry/itchy eyes).     furosemide  (LASIX ) 20 MG tablet Take 1 tablet (20 mg total) by mouth daily. 30 tablet 11   LORazepam  (ATIVAN ) 1 MG tablet Take 1 tablet 30 to 60 minutes prior to leaving house on day of office surgery. Bring second tablet with you to office on day of office surgery. 2 tablet 0   Magnesium Citrate (MAGNESIUM GUMMIES PO) Take by mouth.     methocarbamol  (ROBAXIN ) 750 MG tablet Take 1 tablet (750 mg total) by mouth every 6 (six) hours as needed for muscle spasms. 30 tablet 2   Multiple Vitamins-Minerals (MULTIVITAMIN WITH MINERALS) tablet Take 1 tablet by mouth daily.     nitroGLYCERIN  (NITROSTAT ) 0.4 MG SL tablet Place 1 tablet (0.4 mg total) under the tongue every 5 (five) minutes as needed for chest pain. 5 tablet 1   olmesartan  (BENICAR ) 20 MG tablet Take 1/2 (one-half) tablet by mouth once daily 45 tablet 3   OVER THE COUNTER MEDICATION Take 12 mg by mouth at bedtime. CBD 12 mg XITE with Delta 9 (9 mg)     OVER THE COUNTER MEDICATION 1 Package daily. Activate C - immune complex     OVER THE COUNTER MEDICATION Miranga     oxyCODONE -acetaminophen  (PERCOCET/ROXICET) 5-325 MG tablet Take 1 tablet by mouth 2 (two) times daily as needed.     pregabalin  (LYRICA ) 100 MG capsule Take 100 mg by mouth 2 (two) times daily.     Prenatal  Multivit-Min-Fe-FA (PRENATAL 1 + IRON  PO) Take by mouth.     valACYclovir  (VALTREX ) 1000 MG tablet Take 1 tablet (1,000 mg total) by mouth daily. 90 tablet 2   Vitamin D-Vitamin K (K2 PLUS D3 PO) Take 1 tablet by mouth daily.     No current facility-administered medications for this visit.   Facility-Administered Medications Ordered in Other Visits  Medication Dose Route Frequency Provider Last Rate Last Admin   sodium chloride  flush (NS) 0.9 % injection 10 mL  10 mL Intracatheter Once PRN [place] Andrez LABOR, PA-C         ASSESSMENT & PLAN:   Assessment/Plan:  Emmelina Mcloughlin is a 69 y.o. female with iron  deficiency anemia of uncertain etiology.  Most likely this is due to chronic GI  blood loss versus decreased absorption due to the severity of the iron  deficiency, I will start her on IV iron  sucrose today.  We discussed the rare but serious side effect of allergic reaction and lowering blood pressure, as well as more common side effects of headache, flushing, myalgias/arthralgias, nausea and irritation at the injection site.  We will have her see Dr. Charlanne for consideration of EGD and colonoscopy.  Due to her maternal aunt having breast cancer in her 26s, as well as ovarian cancer, I recommend referral to genetic counselor for consideration of testing for hereditary cancer syndromes.  I will plan to see her back in 4 to 6 weeks for repeat clinical assessment.  I discussed the assessment and treatment plan with the patient.  The patient was provided an opportunity to ask questions and all were answered.  The patient agreed with the plan and demonstrated an understanding of the instructions.    Thank you for the referral    45 minutes was spent in patient care.  This included time spent preparing to see the patient (e.g., review of tests), obtaining and/or reviewing separately obtained history, counseling and educating the patient/family/caregiver, ordering medications, tests, or procedures;  documenting clinical information in the electronic or other health record, independently interpreting results and communicating results to the patient/family/caregiver as well as coordination of care.      Andrez DELENA Foy, PA-C   Physician Assistant [facility] [place] [phone]         [1]  Allergies Allergen Reactions   Lopressor [Metoprolol Tartrate] Other (See Comments)    Heart Attacks    Cortisone Hypertension    Elevates B/P    Codeine Nausea And Vomiting   Latex Rash   Neurontin [Gabapentin] Anxiety and Other (See Comments)    Made pt feel angry, and caused anxiety    "

## 2024-09-11 ENCOUNTER — Other Ambulatory Visit: Payer: Self-pay

## 2024-09-11 ENCOUNTER — Other Ambulatory Visit: Payer: Self-pay | Admitting: Acute Care

## 2024-09-11 ENCOUNTER — Telehealth: Payer: Self-pay

## 2024-09-11 DIAGNOSIS — Z122 Encounter for screening for malignant neoplasm of respiratory organs: Secondary | ICD-10-CM

## 2024-09-11 DIAGNOSIS — D509 Iron deficiency anemia, unspecified: Secondary | ICD-10-CM

## 2024-09-11 DIAGNOSIS — Z87891 Personal history of nicotine dependence: Secondary | ICD-10-CM

## 2024-09-11 NOTE — Telephone Encounter (Signed)
 Referral entered for [facility].

## 2024-09-11 NOTE — Telephone Encounter (Signed)
-----   Message from Andrez DELENA Foy sent at 09/08/2024  5:25 PM EST ----- She is established with Dr. Charlanne. Please refer her for evaluation of iron  deficiency. Thanks

## 2024-09-12 ENCOUNTER — Inpatient Hospital Stay

## 2024-09-12 ENCOUNTER — Encounter: Payer: Self-pay | Admitting: Hematology and Oncology

## 2024-09-12 VITALS — BP 145/68 | HR 66 | Temp 98.0°F | Resp 18

## 2024-09-12 DIAGNOSIS — D509 Iron deficiency anemia, unspecified: Secondary | ICD-10-CM | POA: Diagnosis not present

## 2024-09-12 MED ORDER — SODIUM CHLORIDE 0.9% FLUSH: 10.0000 mL | Freq: Once | INTRAVENOUS | Status: DC | PRN

## 2024-09-12 MED ORDER — IRON SUCROSE 20 MG/ML IV SOLN
200.0000 mg | Freq: Once | INTRAVENOUS | Status: AC
  Administered 2024-09-12: 200 mg via INTRAVENOUS
  Filled 2024-09-12: qty 10

## 2024-09-12 NOTE — Patient Instructions (Signed)

## 2024-09-14 ENCOUNTER — Encounter: Payer: Self-pay | Admitting: Hematology and Oncology

## 2024-09-14 ENCOUNTER — Inpatient Hospital Stay

## 2024-09-18 ENCOUNTER — Inpatient Hospital Stay

## 2024-09-19 ENCOUNTER — Encounter: Payer: Self-pay | Admitting: Hematology and Oncology

## 2024-09-20 ENCOUNTER — Inpatient Hospital Stay

## 2024-09-21 ENCOUNTER — Inpatient Hospital Stay

## 2024-09-22 ENCOUNTER — Inpatient Hospital Stay

## 2024-09-26 ENCOUNTER — Encounter: Payer: Self-pay | Admitting: Hematology and Oncology

## 2024-10-03 ENCOUNTER — Ambulatory Visit: Payer: Self-pay

## 2024-10-09 ENCOUNTER — Encounter: Payer: Self-pay | Admitting: Family Medicine

## 2024-10-13 ENCOUNTER — Inpatient Hospital Stay: Admitting: Hematology and Oncology

## 2024-10-13 ENCOUNTER — Inpatient Hospital Stay

## 2025-05-02 ENCOUNTER — Ambulatory Visit
